# Patient Record
Sex: Female | Born: 1935 | Race: White | Hispanic: No | Marital: Married | Smoking: Never smoker
Health system: Southern US, Community
[De-identification: ages and names within clinical notes are randomized; demographics above are authoritative.]

## PROBLEM LIST (undated history)

## (undated) DIAGNOSIS — M1990 Unspecified osteoarthritis, unspecified site: Secondary | ICD-10-CM

## (undated) DIAGNOSIS — R42 Dizziness and giddiness: Secondary | ICD-10-CM

## (undated) DIAGNOSIS — I10 Essential (primary) hypertension: Secondary | ICD-10-CM

## (undated) HISTORY — PX: OVARIAN CYST SURGERY: SHX726

## (undated) HISTORY — PX: JOINT REPLACEMENT: SHX530

## (undated) HISTORY — PX: TONSILLECTOMY: SUR1361

---

## 2004-06-09 ENCOUNTER — Ambulatory Visit: Payer: Self-pay | Admitting: Internal Medicine

## 2005-06-14 ENCOUNTER — Ambulatory Visit: Payer: Self-pay | Admitting: Internal Medicine

## 2006-06-13 ENCOUNTER — Ambulatory Visit: Payer: Self-pay | Admitting: Internal Medicine

## 2006-08-01 ENCOUNTER — Ambulatory Visit: Payer: Self-pay | Admitting: Internal Medicine

## 2007-08-04 ENCOUNTER — Ambulatory Visit: Payer: Self-pay | Admitting: Internal Medicine

## 2008-08-06 ENCOUNTER — Ambulatory Visit: Payer: Self-pay | Admitting: Internal Medicine

## 2009-08-08 ENCOUNTER — Ambulatory Visit: Payer: Self-pay | Admitting: Internal Medicine

## 2010-08-10 ENCOUNTER — Ambulatory Visit: Payer: Self-pay | Admitting: Internal Medicine

## 2011-08-13 ENCOUNTER — Ambulatory Visit: Payer: Self-pay | Admitting: Internal Medicine

## 2012-08-13 ENCOUNTER — Ambulatory Visit: Payer: Self-pay | Admitting: Internal Medicine

## 2014-03-17 ENCOUNTER — Ambulatory Visit: Payer: Self-pay | Admitting: Internal Medicine

## 2015-07-29 ENCOUNTER — Other Ambulatory Visit: Payer: Self-pay | Admitting: Internal Medicine

## 2015-07-29 DIAGNOSIS — R053 Chronic cough: Secondary | ICD-10-CM

## 2015-07-29 DIAGNOSIS — R05 Cough: Secondary | ICD-10-CM

## 2015-08-22 ENCOUNTER — Ambulatory Visit
Admission: RE | Admit: 2015-08-22 | Discharge: 2015-08-22 | Disposition: A | Discharge status: Home / Self Care | Payer: Medicare Other | Source: Ambulatory Visit | Attending: Internal Medicine | Admitting: Internal Medicine

## 2015-08-22 DIAGNOSIS — J9811 Atelectasis: Secondary | ICD-10-CM | POA: Insufficient documentation

## 2015-08-22 DIAGNOSIS — I2510 Atherosclerotic heart disease of native coronary artery without angina pectoris: Secondary | ICD-10-CM | POA: Insufficient documentation

## 2015-08-22 DIAGNOSIS — R05 Cough: Secondary | ICD-10-CM

## 2015-08-22 DIAGNOSIS — R938 Abnormal findings on diagnostic imaging of other specified body structures: Secondary | ICD-10-CM | POA: Insufficient documentation

## 2015-08-22 DIAGNOSIS — R053 Chronic cough: Secondary | ICD-10-CM

## 2015-10-14 ENCOUNTER — Other Ambulatory Visit: Payer: Self-pay | Admitting: Neurology

## 2015-10-14 DIAGNOSIS — R413 Other amnesia: Secondary | ICD-10-CM

## 2015-10-14 DIAGNOSIS — R270 Ataxia, unspecified: Secondary | ICD-10-CM

## 2015-10-25 ENCOUNTER — Ambulatory Visit
Admission: RE | Admit: 2015-10-25 | Discharge: 2015-10-25 | Disposition: A | Discharge status: Home / Self Care | Payer: Medicare Other | Source: Ambulatory Visit | Attending: Neurology | Admitting: Neurology

## 2015-10-25 DIAGNOSIS — R270 Ataxia, unspecified: Secondary | ICD-10-CM | POA: Diagnosis not present

## 2015-10-25 DIAGNOSIS — R413 Other amnesia: Secondary | ICD-10-CM | POA: Diagnosis present

## 2015-10-25 DIAGNOSIS — G319 Degenerative disease of nervous system, unspecified: Secondary | ICD-10-CM | POA: Insufficient documentation

## 2015-10-25 DIAGNOSIS — R9082 White matter disease, unspecified: Secondary | ICD-10-CM | POA: Insufficient documentation

## 2015-11-09 ENCOUNTER — Other Ambulatory Visit: Payer: Self-pay | Admitting: Internal Medicine

## 2015-11-09 DIAGNOSIS — Z1231 Encounter for screening mammogram for malignant neoplasm of breast: Secondary | ICD-10-CM

## 2015-12-13 ENCOUNTER — Ambulatory Visit
Admission: RE | Admit: 2015-12-13 | Discharge: 2015-12-13 | Disposition: A | Discharge status: Home / Self Care | Payer: Medicare Other | Source: Ambulatory Visit | Attending: Internal Medicine | Admitting: Internal Medicine

## 2015-12-13 DIAGNOSIS — Z1231 Encounter for screening mammogram for malignant neoplasm of breast: Secondary | ICD-10-CM | POA: Insufficient documentation

## 2016-03-02 ENCOUNTER — Emergency Department
Admission: EM | Admit: 2016-03-02 | Discharge: 2016-03-02 | Disposition: A | Discharge status: Home / Self Care | Payer: Medicare Other | Attending: Emergency Medicine | Admitting: Emergency Medicine

## 2016-03-02 DIAGNOSIS — R42 Dizziness and giddiness: Secondary | ICD-10-CM | POA: Insufficient documentation

## 2016-03-02 DIAGNOSIS — R0982 Postnasal drip: Secondary | ICD-10-CM | POA: Diagnosis not present

## 2016-03-02 DIAGNOSIS — I10 Essential (primary) hypertension: Secondary | ICD-10-CM | POA: Diagnosis not present

## 2016-03-02 HISTORY — DX: Essential (primary) hypertension: I10

## 2016-03-02 LAB — GLUCOSE, CAPILLARY: GLUCOSE-CAPILLARY: 94 mg/dL (ref 65–99)

## 2016-03-02 MED ORDER — FLUTICASONE PROPIONATE 50 MCG/ACT NA SUSP: 2.0000 | Freq: Every day | NASAL | 0 refills | Status: AC

## 2016-03-02 MED ORDER — MECLIZINE HCL 25 MG PO TABS: 25.0000 mg | ORAL_TABLET | Freq: Three times a day (TID) | ORAL | 0 refills | Status: AC | PRN

## 2016-03-02 NOTE — ED Triage Notes (Signed)
Pt c/o sinus congestion, right ear pain and dizziness with fast movement. Pt denies dizziness at this time.

## 2016-03-02 NOTE — ED Notes (Signed)
Pt c/o RT ear pain radiating into her throat, started this am. Denies any fevers. Pt states she has an episode of dizziness this am upon waking. Pt A&Ox4.

## 2016-03-02 NOTE — ED Provider Notes (Signed)
[facility] Emergency Department Provider Note  ____________________________________________   First MD Initiated Contact with Patient 03/02/16 1525     (approximate)  I have reviewed the triage vital signs and the nursing notes.   HISTORY  Chief Complaint Otalgia   HPI Sarah Friedman is a 82 y.o. female with a history of hypertension was presenting to the emergency department with right-sided ear pressure as well as vertigo. Said that she wasn't able to walk this morning and it was across the floor because of the feeling of the room spinning. She said that also when she would move her head from side to side especially to the right side that the spinning sensation would worsen. She says that she has had sinus congestion and feels like she has mucus draining to the back of her throat. Denies any weakness or numbness. Says that the symptoms have improved at this time and she is able to ambulate without any difficulty.   Past Medical History:  Diagnosis Date  . Hypertension     There are no active problems to display for this patient.   History reviewed. No pertinent surgical history.  Prior to Admission medications   Not on File    Allergies Aspirin; Penicillins; and Sulfa antibiotics  No family history on file.  Social History Social History  Substance Use Topics  . Smoking status: Never Smoker  . Smokeless tobacco: Not on file  . Alcohol use No    Review of Systems Constitutional: No fever/chills Eyes: No visual changes. ENT: No sore throat. Cardiovascular: Denies chest pain. Respiratory: Denies shortness of breath. Gastrointestinal: No abdominal pain.  No nausea, no vomiting.  No diarrhea.  No constipation. Genitourinary: Negative for dysuria. Musculoskeletal: Negative for back pain. Skin: Negative for rash. Neurological: Negative for headaches, focal weakness or numbness.  10-point ROS otherwise  negative.  ____________________________________________   PHYSICAL EXAM:  VITAL SIGNS: ED Triage Vitals  Enc Vitals Group     BP 03/02/16 1507 (!) 184/84     Pulse Rate 03/02/16 1507 80     Resp 03/02/16 1507 18     Temp 03/02/16 1507 98 F (36.7 C)     Temp Source 03/02/16 1507 Oral     SpO2 03/02/16 1507 100 %     Weight 03/02/16 1507 104 lb (47.2 kg)     Height 03/02/16 1507 5\' 4"  (1.626 m)     Head Circumference --      Peak Flow --      Pain Score 03/02/16 1519 2     Pain Loc --      Pain Edu? --      Excl. in GC? --     Constitutional: Alert and oriented. Well appearing and in no acute distress. Eyes: Conjunctivae are normal. PERRL. EOMI. Head: Atraumatic.TMs normal bilaterally. Nose: No congestion/rhinnorhea. Mouth/Throat: Mucous membranes are moist.  Oropharynx non-erythematous. Neck: No stridor.   Cardiovascular: Normal rate, regular rhythm. Grossly normal heart sounds.   Respiratory: Normal respiratory effort.  No retractions. Lungs CTAB. Gastrointestinal: Soft and nontender. No distention. No abdominal bruits. No CVA tenderness. Musculoskeletal: No lower extremity tenderness nor edema.  No joint effusions. Neurologic:  Normal speech and language. No gross focal neurologic deficits are appreciated. No gait instability.  Walks with a normal gait unassisted. No nystagmus. No ataxia on finger to nose testing. Skin:  Skin is warm, dry and intact. No rash noted. Psychiatric: Mood and affect are normal. Speech and behavior are normal.  ____________________________________________   LABS (all labs ordered are listed, but only abnormal results are displayed)  Labs Reviewed  GLUCOSE, CAPILLARY   ____________________________________________  EKG   ____________________________________________  RADIOLOGY   ____________________________________________   PROCEDURES  Procedure(s) performed:   Procedures  Critical Care performed:    ____________________________________________   INITIAL IMPRESSION / ASSESSMENT AND PLAN / ED COURSE  Pertinent labs & imaging results that were available during my care of the patient were reviewed by me and considered in my medical decision making (see chart for details).   Clinical Course   Patient without any objective findings at this time. Likely eustachian tube dysfunction and viral infection leading to vertigo. We'll prescribe meclizine as well as Flonase. Patient will follow-up with her primary care doctor, Dr. Judithann SheenSparks. She is understanding the plan and willing to comply. No objective findings to indicate concern for stroke. Patient is not taking her blood pressure medication today which likely Spencer hypertension.   ____________________________________________   FINAL CLINICAL IMPRESSION(S) / ED DIAGNOSES  Vertigo. Postnasal drip.    NEW MEDICATIONS STARTED DURING THIS VISIT:  New Prescriptions   No medications on file     Note:  This document was prepared using Dragon voice recognition software and may include unintentional dictation errors.    Myrna Blazeravid Matthew Ovella [place]goats, MD 03/02/16 1537

## 2017-04-15 ENCOUNTER — Encounter: Payer: Self-pay | Admitting: *Deleted

## 2017-04-23 ENCOUNTER — Ambulatory Visit: Payer: Medicare Other | Admitting: Certified Registered Nurse Anesthetist

## 2017-04-23 ENCOUNTER — Ambulatory Visit
Admission: RE | Admit: 2017-04-23 | Discharge: 2017-04-23 | Disposition: A | Discharge status: Home / Self Care | Payer: Medicare Other | Source: Ambulatory Visit | Attending: Ophthalmology | Admitting: Ophthalmology

## 2017-04-23 ENCOUNTER — Encounter
Admission: RE | Disposition: A | Discharge status: Home / Self Care | Payer: Self-pay | Source: Ambulatory Visit | Attending: Ophthalmology

## 2017-04-23 ENCOUNTER — Encounter: Payer: Self-pay | Admitting: Emergency Medicine

## 2017-04-23 DIAGNOSIS — R42 Dizziness and giddiness: Secondary | ICD-10-CM | POA: Diagnosis not present

## 2017-04-23 DIAGNOSIS — Z880 Allergy status to penicillin: Secondary | ICD-10-CM | POA: Insufficient documentation

## 2017-04-23 DIAGNOSIS — I10 Essential (primary) hypertension: Secondary | ICD-10-CM | POA: Insufficient documentation

## 2017-04-23 DIAGNOSIS — Z79899 Other long term (current) drug therapy: Secondary | ICD-10-CM | POA: Insufficient documentation

## 2017-04-23 DIAGNOSIS — H2511 Age-related nuclear cataract, right eye: Secondary | ICD-10-CM | POA: Insufficient documentation

## 2017-04-23 DIAGNOSIS — E785 Hyperlipidemia, unspecified: Secondary | ICD-10-CM | POA: Insufficient documentation

## 2017-04-23 DIAGNOSIS — Z882 Allergy status to sulfonamides status: Secondary | ICD-10-CM | POA: Insufficient documentation

## 2017-04-23 HISTORY — DX: Unspecified osteoarthritis, unspecified site: M19.90

## 2017-04-23 HISTORY — DX: Dizziness and giddiness: R42

## 2017-04-23 HISTORY — PX: CATARACT EXTRACTION W/PHACO: SHX586

## 2017-04-23 SURGERY — PHACOEMULSIFICATION, CATARACT, WITH IOL INSERTION
Anesthesia: Monitor Anesthesia Care | Site: Eye | Laterality: Right | Wound class: Clean

## 2017-04-23 MED ORDER — FENTANYL CITRATE (PF) 100 MCG/2ML IJ SOLN
INTRAMUSCULAR | Status: DC | PRN
  Administered 2017-04-23: 25 ug via INTRAVENOUS

## 2017-04-23 MED ORDER — POVIDONE-IODINE 5 % OP SOLN
OPHTHALMIC | Status: DC | PRN
  Administered 2017-04-23: 1 via OPHTHALMIC

## 2017-04-23 MED ORDER — POVIDONE-IODINE 5 % OP SOLN
OPHTHALMIC | Status: AC
  Filled 2017-04-23: qty 30

## 2017-04-23 MED ORDER — ARMC OPHTHALMIC DILATING DROPS
1.0000 "application " | OPHTHALMIC | Status: AC
  Administered 2017-04-23 (×3): 1 via OPHTHALMIC

## 2017-04-23 MED ORDER — EPINEPHRINE PF 1 MG/ML IJ SOLN
INTRAOCULAR | Status: DC | PRN
  Administered 2017-04-23: 09:00:00 via OPHTHALMIC

## 2017-04-23 MED ORDER — SODIUM CHLORIDE 0.9 % IV SOLN
INTRAVENOUS | Status: DC
  Administered 2017-04-23: 08:00:00 via INTRAVENOUS

## 2017-04-23 MED ORDER — EPINEPHRINE PF 1 MG/ML IJ SOLN
INTRAMUSCULAR | Status: AC
  Filled 2017-04-23: qty 2

## 2017-04-23 MED ORDER — MOXIFLOXACIN HCL 0.5 % OP SOLN
OPHTHALMIC | Status: AC
  Filled 2017-04-23: qty 3

## 2017-04-23 MED ORDER — LIDOCAINE HCL (PF) 4 % IJ SOLN
INTRAOCULAR | Status: DC | PRN
  Administered 2017-04-23: 4 mL via OPHTHALMIC

## 2017-04-23 MED ORDER — NA CHONDROIT SULF-NA HYALURON 40-17 MG/ML IO SOLN
INTRAOCULAR | Status: DC | PRN
  Administered 2017-04-23: 1 mL via INTRAOCULAR

## 2017-04-23 MED ORDER — MOXIFLOXACIN HCL 0.5 % OP SOLN: 1.0000 [drp] | OPHTHALMIC | Status: DC | PRN

## 2017-04-23 MED ORDER — CARBACHOL 0.01 % IO SOLN
INTRAOCULAR | Status: DC | PRN
  Administered 2017-04-23: 0.5 mL via INTRAOCULAR

## 2017-04-23 MED ORDER — FENTANYL CITRATE (PF) 100 MCG/2ML IJ SOLN
INTRAMUSCULAR | Status: AC
  Filled 2017-04-23: qty 2

## 2017-04-23 MED ORDER — ARMC OPHTHALMIC DILATING DROPS
OPHTHALMIC | Status: AC
  Administered 2017-04-23: 1 via OPHTHALMIC
  Filled 2017-04-23: qty 0.4

## 2017-04-23 MED ORDER — NA CHONDROIT SULF-NA HYALURON 40-17 MG/ML IO SOLN
INTRAOCULAR | Status: AC
  Filled 2017-04-23: qty 1

## 2017-04-23 MED ORDER — MOXIFLOXACIN HCL 0.5 % OP SOLN
OPHTHALMIC | Status: DC | PRN
  Administered 2017-04-23: 0.2 mL via OPHTHALMIC

## 2017-04-23 MED ORDER — LIDOCAINE HCL (PF) 4 % IJ SOLN
INTRAMUSCULAR | Status: AC
  Filled 2017-04-23: qty 5

## 2017-04-23 SURGICAL SUPPLY — 18 items
GLOVE BIO SURGEON STRL SZ8 (GLOVE) ×2 IMPLANT
GLOVE BIOGEL M 6.5 STRL (GLOVE) ×2 IMPLANT
GLOVE SURG LX 8.0 MICRO (GLOVE) ×1
GLOVE SURG LX STRL 8.0 MICRO (GLOVE) ×1 IMPLANT
GOWN STRL REUS W/ TWL LRG LVL3 (GOWN DISPOSABLE) ×2 IMPLANT
GOWN STRL REUS W/TWL LRG LVL3 (GOWN DISPOSABLE) ×2
LABEL CATARACT MEDS ST (LABEL) ×2 IMPLANT
LENS IOL ACRSF IQ TRC 5 19.0 ×1 IMPLANT
LENS IOL ACRYSOF IQ TORIC 19.0 ×1 IMPLANT
LENS IOL IQ TORIC 5 19.0 ×1 IMPLANT
PACK CATARACT (MISCELLANEOUS) ×2 IMPLANT
PACK CATARACT BRASINGTON LX (MISCELLANEOUS) ×2 IMPLANT
PACK EYE AFTER SURG (MISCELLANEOUS) ×2 IMPLANT
SOL BSS BAG (MISCELLANEOUS) ×2
SOLUTION BSS BAG (MISCELLANEOUS) ×1 IMPLANT
SYR 5ML LL (SYRINGE) ×2 IMPLANT
WATER STERILE IRR 250ML POUR (IV SOLUTION) ×2 IMPLANT
WIPE NON LINTING 3.25X3.25 (MISCELLANEOUS) ×2 IMPLANT

## 2017-04-23 NOTE — Transfer of Care (Signed)
Immediate Anesthesia Transfer of Care Note  Patient: Sarah Friedman  Procedure(s) Performed: CATARACT EXTRACTION PHACO AND INTRAOCULAR LENS PLACEMENT (IOC) (Right Eye)  Patient Location: PACU  Anesthesia Type:MAC  Level of Consciousness: awake, alert  and oriented  Airway & Oxygen Therapy: Patient Spontanous Breathing  Post-op Assessment: Report given to RN and Post -op Vital signs reviewed and stable  Post vital signs: Reviewed and stable  Last Vitals:  Vitals:   04/23/17 0801  BP: (!) 181/92  Pulse: 69  Resp: 17  Temp: (!) 35.9 C  SpO2: 100%    Last Pain:  Vitals:   04/23/17 0801  TempSrc: Tympanic         Complications: No apparent anesthesia complications

## 2017-04-23 NOTE — Anesthesia Postprocedure Evaluation (Signed)
Anesthesia Post Note  Patient: Sarah Friedman  Procedure(s) Performed: CATARACT EXTRACTION PHACO AND INTRAOCULAR LENS PLACEMENT (IOC) (Right Eye)  Patient location during evaluation: PACU Anesthesia Type: MAC Level of consciousness: awake and alert and oriented Pain management: pain level controlled Vital Signs Assessment: post-procedure vital signs reviewed and stable Respiratory status: spontaneous breathing, nonlabored ventilation and respiratory function stable Cardiovascular status: blood pressure returned to baseline and stable Postop Assessment: no signs of nausea or vomiting Anesthetic complications: no     Last Vitals:  Vitals:   04/23/17 0946 04/23/17 0959  BP: (!) 188/72 (!) 179/86  Pulse: 72 65  Resp: 18 16  Temp: (!) 36.3 C   SpO2: 96% 100%    Last Pain:  Vitals:   04/23/17 0946  TempSrc: Temporal                 Kenniyah Sasaki

## 2017-04-23 NOTE — H&P (Signed)
All labs reviewed. Abnormal studies sent to patients PCP when indicated.  Previous H&P reviewed, patient examined, there are NO CHANGES.  Sarah Dantes Porfilio2/12/20198:48 AM

## 2017-04-23 NOTE — Anesthesia Preprocedure Evaluation (Signed)
Anesthesia Evaluation  Patient identified by MRN, date of birth, ID band Patient awake    Reviewed: Allergy & Precautions, NPO status , Patient's Chart, lab work & pertinent test results  History of Anesthesia Complications Negative for: history of anesthetic complications  Airway Mallampati: II  TM Distance: >3 FB Neck ROM: Full    Dental  (+) Implants   Pulmonary neg pulmonary ROS, neg sleep apnea, neg COPD,    breath sounds clear to auscultation- rhonchi (-) wheezing      Cardiovascular hypertension, Pt. on medications (-) CAD, (-) Past MI, (-) Cardiac Stents and (-) CABG  Rhythm:Regular Rate:Normal - Systolic murmurs and - Diastolic murmurs    Neuro/Psych negative neurological ROS  negative psych ROS   GI/Hepatic negative GI ROS, Neg liver ROS,   Endo/Other  negative endocrine ROSneg diabetes  Renal/GU negative Renal ROS     Musculoskeletal  (+) Arthritis ,   Abdominal (+) - obese,   Peds  Hematology negative hematology ROS (+)   Anesthesia Other Findings Past Medical History: No date: Arthritis No date: Hypertension No date: Vertigo   Reproductive/Obstetrics                             Anesthesia Physical Anesthesia Plan  ASA: II  Anesthesia Plan: MAC   Post-op Pain Management:    Induction: Intravenous  PONV Risk Score and Plan: 2 and Midazolam  Airway Management Planned: Natural Airway  Additional Equipment:   Intra-op Plan:   Post-operative Plan:   Informed Consent: I have reviewed the patients History and Physical, chart, labs and discussed the procedure including the risks, benefits and alternatives for the proposed anesthesia with the patient or authorized representative who has indicated his/her understanding and acceptance.     Plan Discussed with: CRNA and Anesthesiologist  Anesthesia Plan Comments:         Anesthesia Quick Evaluation

## 2017-04-23 NOTE — Op Note (Signed)
PREOPERATIVE DIAGNOSIS:  Nuclear sclerotic cataract of the right eye.   POSTOPERATIVE DIAGNOSIS:  Nuclear sclerotic cataract of the right eye.   OPERATIVE PROCEDURE: Procedure(s): CATARACT EXTRACTION PHACO AND INTRAOCULAR LENS PLACEMENT (IOC)   SURGEON:  Galen ManilaWilliam Neena Beecham, MD.   ANESTHESIA: 1.      Managed anesthesia care. 2.     0.811ml of Shugarcaine was instilled following the paracentesis  Anesthesiologist: Alver FisherPenwarden, Amy, MD CRNA: Malva CoganBeane, Catherine, CRNA  COMPLICATIONS:  None.   TECHNIQUE:   Stop and chop    DESCRIPTION OF PROCEDURE:  The patient was examined and consented in the preoperative holding area where the aforementioned topical anesthesia was applied to the right eye.  The patient was brought back to the Operating Room where he was sat upright on the gurney and given a target to fixate upon while the eye was marked at the 3:00 and 9:00 position.  The patient was then reclined on the operating table.  The eye was prepped and draped in the usual sterile ophthalmic fashion and a lid speculum was placed. A paracentesis was created with the side port blade and the anterior chamber was filled with viscoelastic. A near clear corneal incision was performed with the steel keratome. A continuous curvilinear capsulorrhexis was performed with a cystotome followed by the capsulorrhexis forceps. Hydrodissection and hydrodelineation were carried out with BSS on a blunt cannula. The lens was removed in a stop and chop technique and the remaining cortical material was removed with the irrigation-aspiration handpiece. The eye was inflated with viscoelastic and the ZCT  lens  was placed in the eye and rotated to within a few degrees of the predetermined orientation.  The remaining viscoelastic was removed from the eye.  The Sinskey hook was used to rotate the toric lens into its final resting place at 008 degrees.  0. The eye was inflated to a physiologic pressure and found to be watertight. 0.641ml of  Vigamox was placed in the anterior chamber.  The eye was dressed with Vigamox. The patient was given protective glasses to wear throughout the day and a shield with which to sleep tonight. The patient was also given drops with which to begin a drop regimen today and will follow-up with me in one day. Implant Name Type Inv. Item Serial No. Manufacturer Lot No. LRB No. Used  LENS IOL TORIC 19.0 - [id number]  LENS IOL TORIC 19.0 [id number] ALCON  Right 1   Procedure(s) with comments: CATARACT EXTRACTION PHACO AND INTRAOCULAR LENS PLACEMENT (IOC) (Right) - US 00:43.8 AP% 18.3 CDE 8.05 Fluid Pack lot # [id number]  Electronically signed: Galen ManilaWilliam Etherine Mackowiak 04/23/2017 9:46 AM

## 2017-04-23 NOTE — Anesthesia Post-op Follow-up Note (Signed)
Anesthesia QCDR form completed.        

## 2017-04-23 NOTE — Anesthesia Procedure Notes (Signed)
Procedure Name: MAC Performed by: Demetrius Charity, CRNA Pre-anesthesia Checklist: Patient identified, Emergency Drugs available, Suction available, Patient being monitored and Timeout performed Oxygen Delivery Method: Nasal cannula

## 2017-04-23 NOTE — Discharge Instructions (Signed)
Eye Surgery Discharge Instructions  Expect mild scratchy sensation or mild soreness. DO NOT RUB YOUR EYE!  The day of surgery:  Minimal physical activity, but bed rest is not required  No reading, computer work, or close hand work  No bending, lifting, or straining.  May watch TV  For 24 hours:  No driving, legal decisions, or alcoholic beverages  Safety precautions  Eat anything you prefer: It is better to start with liquids, then soup then solid foods.  _____ Eye patch should be worn until postoperative exam tomorrow.  ____ Solar shield eyeglasses should be worn for comfort in the sunlight/patch while sleeping  Resume all regular medications including aspirin or Coumadin if these were discontinued prior to surgery. You may shower, bathe, shave, or wash your hair. Tylenol may be taken for mild discomfort.  Call your doctor if you experience significant pain, nausea, or vomiting, fever > 101 or other signs of infection. [phone] or [phone] Specific instructions:  Follow-up Information    Galen ManilaPorfilio, William, MD Follow up.   Specialty:  Ophthalmology Why:  February 13 at [facility]9:00am Contact information: [address] [place] [address] [phone]

## 2017-04-24 ENCOUNTER — Encounter: Payer: Self-pay | Admitting: Ophthalmology

## 2017-05-07 ENCOUNTER — Encounter: Payer: Self-pay | Admitting: *Deleted

## 2017-05-14 ENCOUNTER — Ambulatory Visit: Payer: Medicare Other | Admitting: Certified Registered Nurse Anesthetist

## 2017-05-14 ENCOUNTER — Ambulatory Visit
Admission: RE | Admit: 2017-05-14 | Discharge: 2017-05-14 | Disposition: A | Discharge status: Home / Self Care | Payer: Medicare Other | Source: Ambulatory Visit | Attending: Ophthalmology | Admitting: Ophthalmology

## 2017-05-14 ENCOUNTER — Encounter
Admission: RE | Disposition: A | Discharge status: Home / Self Care | Payer: Self-pay | Source: Ambulatory Visit | Attending: Ophthalmology

## 2017-05-14 DIAGNOSIS — Z885 Allergy status to narcotic agent status: Secondary | ICD-10-CM | POA: Insufficient documentation

## 2017-05-14 DIAGNOSIS — I10 Essential (primary) hypertension: Secondary | ICD-10-CM | POA: Insufficient documentation

## 2017-05-14 DIAGNOSIS — H2512 Age-related nuclear cataract, left eye: Secondary | ICD-10-CM | POA: Diagnosis present

## 2017-05-14 DIAGNOSIS — Z888 Allergy status to other drugs, medicaments and biological substances status: Secondary | ICD-10-CM | POA: Insufficient documentation

## 2017-05-14 DIAGNOSIS — Z9841 Cataract extraction status, right eye: Secondary | ICD-10-CM | POA: Diagnosis not present

## 2017-05-14 DIAGNOSIS — Z882 Allergy status to sulfonamides status: Secondary | ICD-10-CM | POA: Diagnosis not present

## 2017-05-14 DIAGNOSIS — Z880 Allergy status to penicillin: Secondary | ICD-10-CM | POA: Diagnosis not present

## 2017-05-14 DIAGNOSIS — Z96652 Presence of left artificial knee joint: Secondary | ICD-10-CM | POA: Diagnosis not present

## 2017-05-14 DIAGNOSIS — Z886 Allergy status to analgesic agent status: Secondary | ICD-10-CM | POA: Insufficient documentation

## 2017-05-14 HISTORY — PX: CATARACT EXTRACTION W/PHACO: SHX586

## 2017-05-14 SURGERY — PHACOEMULSIFICATION, CATARACT, WITH IOL INSERTION
Anesthesia: Monitor Anesthesia Care | Site: Eye | Laterality: Left | Wound class: Clean

## 2017-05-14 MED ORDER — MIDAZOLAM HCL 2 MG/2ML IJ SOLN
INTRAMUSCULAR | Status: AC
  Filled 2017-05-14: qty 2

## 2017-05-14 MED ORDER — MOXIFLOXACIN HCL 0.5 % OP SOLN
OPHTHALMIC | Status: AC
  Filled 2017-05-14: qty 3

## 2017-05-14 MED ORDER — EPINEPHRINE PF 1 MG/ML IJ SOLN
INTRAMUSCULAR | Status: AC
  Filled 2017-05-14: qty 2

## 2017-05-14 MED ORDER — POVIDONE-IODINE 5 % OP SOLN
OPHTHALMIC | Status: AC
  Filled 2017-05-14: qty 30

## 2017-05-14 MED ORDER — SODIUM CHLORIDE 0.9 % IV SOLN
INTRAVENOUS | Status: DC
  Administered 2017-05-14: 10:00:00 via INTRAVENOUS

## 2017-05-14 MED ORDER — ARMC OPHTHALMIC DILATING DROPS
1.0000 "application " | OPHTHALMIC | Status: AC
  Administered 2017-05-14 (×2): 1 via OPHTHALMIC
  Administered 2017-05-14: 10:00:00 via OPHTHALMIC

## 2017-05-14 MED ORDER — LIDOCAINE HCL (PF) 4 % IJ SOLN
INTRAOCULAR | Status: DC | PRN
  Administered 2017-05-14: 4 mL via OPHTHALMIC

## 2017-05-14 MED ORDER — POVIDONE-IODINE 5 % OP SOLN
OPHTHALMIC | Status: DC | PRN
  Administered 2017-05-14: 1 via OPHTHALMIC

## 2017-05-14 MED ORDER — MIDAZOLAM HCL 2 MG/2ML IJ SOLN
INTRAMUSCULAR | Status: DC | PRN
  Administered 2017-05-14: 0.5 mg via INTRAVENOUS
  Administered 2017-05-14: 1 mg via INTRAVENOUS
  Administered 2017-05-14: 0.5 mg via INTRAVENOUS

## 2017-05-14 MED ORDER — MOXIFLOXACIN HCL 0.5 % OP SOLN
OPHTHALMIC | Status: DC | PRN
  Administered 2017-05-14: 0.2 mL via OPHTHALMIC

## 2017-05-14 MED ORDER — NA CHONDROIT SULF-NA HYALURON 40-17 MG/ML IO SOLN
INTRAOCULAR | Status: DC | PRN
  Administered 2017-05-14: 1 mL via INTRAOCULAR

## 2017-05-14 MED ORDER — MOXIFLOXACIN HCL 0.5 % OP SOLN: 1.0000 [drp] | OPHTHALMIC | Status: DC | PRN

## 2017-05-14 MED ORDER — ARMC OPHTHALMIC DILATING DROPS
OPHTHALMIC | Status: AC
  Filled 2017-05-14: qty 0.4

## 2017-05-14 MED ORDER — LIDOCAINE HCL (PF) 4 % IJ SOLN
INTRAMUSCULAR | Status: AC
  Filled 2017-05-14: qty 5

## 2017-05-14 MED ORDER — EPINEPHRINE PF 1 MG/ML IJ SOLN
INTRAOCULAR | Status: DC | PRN
  Administered 2017-05-14: 11:00:00 via OPHTHALMIC

## 2017-05-14 MED ORDER — NA CHONDROIT SULF-NA HYALURON 40-17 MG/ML IO SOLN
INTRAOCULAR | Status: AC
  Filled 2017-05-14: qty 1

## 2017-05-14 MED ORDER — CARBACHOL 0.01 % IO SOLN
INTRAOCULAR | Status: DC | PRN
  Administered 2017-05-14: 0.5 mL via INTRAOCULAR

## 2017-05-14 SURGICAL SUPPLY — 18 items
GLOVE BIO SURGEON STRL SZ8 (GLOVE) ×3 IMPLANT
GLOVE BIOGEL M 6.5 STRL (GLOVE) ×3 IMPLANT
GLOVE SURG LX 8.0 MICRO (GLOVE) ×2
GLOVE SURG LX STRL 8.0 MICRO (GLOVE) ×1 IMPLANT
GOWN STRL REUS W/ TWL LRG LVL3 (GOWN DISPOSABLE) ×2 IMPLANT
GOWN STRL REUS W/TWL LRG LVL3 (GOWN DISPOSABLE) ×4
LABEL CATARACT MEDS ST (LABEL) ×3 IMPLANT
LENS IOL ACRSF IQ TRC 4 18.0 ×1 IMPLANT
LENS IOL ACRYSOF IQ TORIC 18.0 ×2 IMPLANT
LENS IOL IQ TORIC 4 18.0 ×1 IMPLANT
PACK CATARACT (MISCELLANEOUS) ×3 IMPLANT
PACK CATARACT BRASINGTON LX (MISCELLANEOUS) ×3 IMPLANT
PACK EYE AFTER SURG (MISCELLANEOUS) ×3 IMPLANT
SOL BSS BAG (MISCELLANEOUS) ×3
SOLUTION BSS BAG (MISCELLANEOUS) ×1 IMPLANT
SYR 5ML LL (SYRINGE) ×3 IMPLANT
WATER STERILE IRR 250ML POUR (IV SOLUTION) ×3 IMPLANT
WIPE NON LINTING 3.25X3.25 (MISCELLANEOUS) ×3 IMPLANT

## 2017-05-14 NOTE — Anesthesia Postprocedure Evaluation (Signed)
Anesthesia Post Note  Patient: Sarah Friedman  Procedure(s) Performed: CATARACT EXTRACTION PHACO AND INTRAOCULAR LENS PLACEMENT (IOC) (Left Eye)  Patient location during evaluation: PACU Anesthesia Type: MAC Level of consciousness: awake and alert Pain management: pain level controlled Vital Signs Assessment: post-procedure vital signs reviewed and stable Respiratory status: spontaneous breathing, nonlabored ventilation, respiratory function stable and patient connected to nasal cannula oxygen Cardiovascular status: stable and blood pressure returned to baseline Postop Assessment: no apparent nausea or vomiting Anesthetic complications: no     Last Vitals:  Vitals:   05/14/17 0925 05/14/17 1057  BP: (!) 190/62 (!) 154/70  Pulse: 60 (!) 56  Resp: 18 16  Temp: (!) 36.4 C 36.7 C  SpO2: 98% 100%    Last Pain:  Vitals:   05/14/17 1057  TempSrc: Temporal                 Martha Clan

## 2017-05-14 NOTE — Transfer of Care (Signed)
Immediate Anesthesia Transfer of Care Note  Patient: Sarah Friedman  Procedure(s) Performed: CATARACT EXTRACTION PHACO AND INTRAOCULAR LENS PLACEMENT (IOC) (Left Eye)  Patient Location: Short Stay  Anesthesia Type:MAC  Level of Consciousness: awake, alert , oriented and patient cooperative  Airway & Oxygen Therapy: Patient Spontanous Breathing  Post-op Assessment: Report given to RN and Post -op Vital signs reviewed and stable  Post vital signs: Reviewed and stable  Last Vitals:  Vitals:   05/14/17 0925  BP: (!) 190/62  Pulse: 60  Resp: 18  Temp: (!) 36.4 C  SpO2: 98%    Last Pain:  Vitals:   05/14/17 0925  TempSrc: Tympanic         Complications: No apparent anesthesia complications

## 2017-05-14 NOTE — Anesthesia Post-op Follow-up Note (Signed)
Anesthesia QCDR form completed.        

## 2017-05-14 NOTE — Anesthesia Preprocedure Evaluation (Signed)
Anesthesia Evaluation  Patient identified by MRN, date of birth, ID band Patient awake    Reviewed: Allergy & Precautions, NPO status , Patient's Chart, lab work & pertinent test results  History of Anesthesia Complications Negative for: history of anesthetic complications  Airway Mallampati: II  TM Distance: >3 FB Neck ROM: Full    Dental  (+) Implants   Pulmonary neg pulmonary ROS, neg sleep apnea, neg COPD,    breath sounds clear to auscultation- rhonchi (-) wheezing      Cardiovascular hypertension, Pt. on medications (-) CAD, (-) Past MI, (-) Cardiac Stents and (-) CABG  Rhythm:Regular Rate:Normal - Systolic murmurs and - Diastolic murmurs    Neuro/Psych negative neurological ROS  negative psych ROS   GI/Hepatic negative GI ROS, Neg liver ROS,   Endo/Other  negative endocrine ROSneg diabetes  Renal/GU negative Renal ROS     Musculoskeletal  (+) Arthritis ,   Abdominal (+) - obese,   Peds  Hematology negative hematology ROS (+)   Anesthesia Other Findings Past Medical History: No date: Arthritis No date: Hypertension No date: Vertigo   Reproductive/Obstetrics                             Anesthesia Physical Anesthesia Plan  ASA: II  Anesthesia Plan: MAC   Post-op Pain Management:    Induction: Intravenous  PONV Risk Score and Plan: 2 and Midazolam  Airway Management Planned: Natural Airway  Additional Equipment:   Intra-op Plan:   Post-operative Plan:   Informed Consent: I have reviewed the patients History and Physical, chart, labs and discussed the procedure including the risks, benefits and alternatives for the proposed anesthesia with the patient or authorized representative who has indicated his/her understanding and acceptance.     Plan Discussed with: CRNA and Anesthesiologist  Anesthesia Plan Comments:         Anesthesia Quick Evaluation  

## 2017-05-14 NOTE — Op Note (Signed)
PREOPERATIVE DIAGNOSIS:  Nuclear sclerotic cataract of the left eye.   POSTOPERATIVE DIAGNOSIS:  Nuclear sclerotic cataract of the left eye.   OPERATIVE PROCEDURE: Procedure(s): CATARACT EXTRACTION PHACO AND INTRAOCULAR LENS PLACEMENT (IOC)   SURGEON:  Galen ManilaWilliam Sivan Quast, MD.   ANESTHESIA: 1.      Managed anesthesia care. 2.     0.661ml os Shugarcaine was instilled following the paracentesis 2oranesstaff@   COMPLICATIONS:  None.   TECHNIQUE:   Stop and chop    DESCRIPTION OF PROCEDURE:  The patient was examined and consented in the preoperative holding area where the aforementioned topical anesthesia was applied to the left eye.  The patient was brought back to the Operating Room where he was sat upright on the gurney and given a target to fixate upon while the eye was marked at the 3:00 and 9:00 position.  The patient was then reclined on the operating table.  The eye was prepped and draped in the usual sterile ophthalmic fashion and a lid speculum was placed. A paracentesis was created with the side port blade and the anterior chamber was filled with viscoelastic. A near clear corneal incision was performed with the steel keratome. A continuous curvilinear capsulorrhexis was performed with a cystotome followed by the capsulorrhexis forceps. Hydrodissection and hydrodelineation were carried out with BSS on a blunt cannula. The lens was removed in a stop and chop technique and the remaining cortical material was removed with the irrigation-aspiration handpiece. The eye was inflated with viscoelastic and the ZCT lens was placed in the eye and rotated to within a few degrees of the predetermined orientation.  The remaining viscoelastic was removed from the eye.  The Sinskey hook was used to rotate the toric lens into its final resting place at 165 degrees.  0.1 ml of Vigamox was placed in the anterior chamber. The eye was inflated to a physiologic pressure and found to be watertight.  The eye was dressed  with Vigamox. The patient was given protective glasses to wear throughout the day and a shield with which to sleep tonight. The patient was also given drops with which to begin a drop regimen today and will follow-up with me in one day. Implant Name Type Inv. Item Serial No. Manufacturer Lot No. LRB No. Used  LENS IOL TORIC 18.0 - [id number]  LENS IOL TORIC 18.0 [id number] ALCON  Left 1   Procedure(s) with comments: CATARACT EXTRACTION PHACO AND INTRAOCULAR LENS PLACEMENT (IOC) (Left) - US 00:29.5 AP% 17.2 CDE 5.07 Fluid Pack Lot # [id number] H  Electronically signed: Galen ManilaWilliam Shantee Hayne 3/5/201910:56 AM

## 2017-05-14 NOTE — H&P (Signed)
All labs reviewed. Abnormal studies sent to patients PCP when indicated.  Previous H&P reviewed, patient examined, there are NO CHANGES.  Elina Streng Porfilio3/5/201910:24 AM

## 2017-05-14 NOTE — OR Nursing (Signed)
IV not documented preop - #22G RAC d/c'd on arrival to postop

## 2017-05-14 NOTE — Discharge Instructions (Signed)
FOLLOW DR. PORFILIO'S POSTOP EYE DROP INSTRUCTION SHEET AS REVIEWED. Eye Surgery Discharge Instructions  Expect mild scratchy sensation or mild soreness. DO NOT RUB YOUR EYE!  The day of surgery:  Minimal physical activity, but bed rest is not required  No reading, computer work, or close hand work  No bending, lifting, or straining.  May watch TV  For 24 hours:  No driving, legal decisions, or alcoholic beverages  Safety precautions  Eat anything you prefer: It is better to start with liquids, then soup then solid foods.  _____ Eye patch should be worn until postoperative exam tomorrow.  ____ Solar shield eyeglasses should be worn for comfort in the sunlight/patch while sleeping  Resume all regular medications including aspirin or Coumadin if these were discontinued prior to surgery. You may shower, bathe, shave, or wash your hair. Tylenol may be taken for mild discomfort.  Call your doctor if you experience significant pain, nausea, or vomiting, fever > 101 or other signs of infection. [phone] or [phone] Specific instructions:  Follow-up Information    Galen ManilaPorfilio, William, MD Follow up.   Specialty:  Ophthalmology Why:  05/15/17 @ 1:40 pm Contact information: [address] [place] [address] [phone]

## 2017-07-08 IMAGING — CT CT CHEST W/O CM
2 of 3 series · 16 of 46 positions shown, 18 images · non-contrast
Comparison: Chest radiographs report dated 06/10/2015.

CLINICAL DATA: Cough since pneumonia diagnosed in [REDACTED].

EXAM:
CT CHEST WITHOUT CONTRAST
TECHNIQUE: Multidetector CT imaging of the chest was performed following the
standard protocol without IV contrast.

[Series 2: routine chest wo · axial · 0.50mm/px · z∈[-190,+105]mm · 13 of 69 slices shown, 15 images]
[im 5/69  soft-tissue]
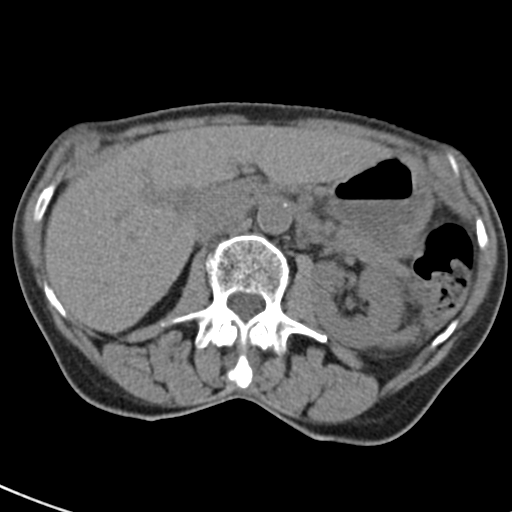
[im 5/69  bone]
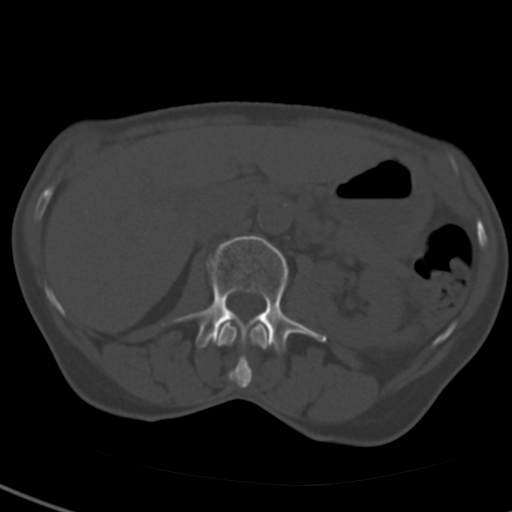
[im 9/69  soft-tissue]
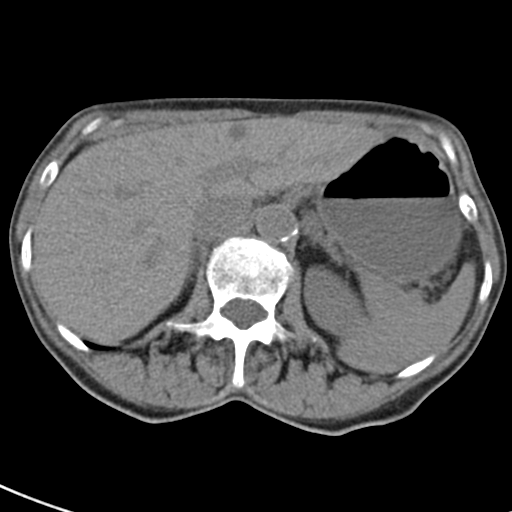
[im 14/69  soft-tissue]
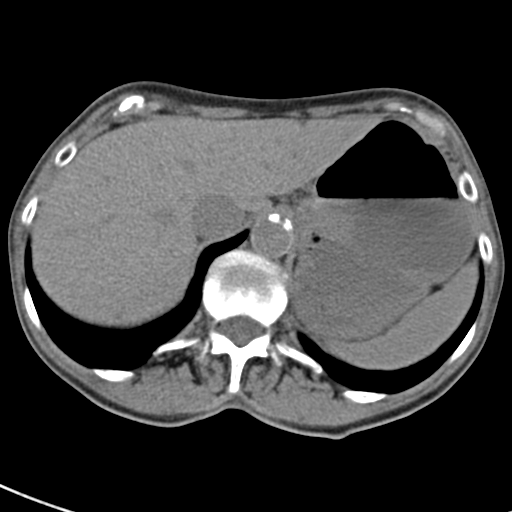
[im 20/69  soft-tissue]
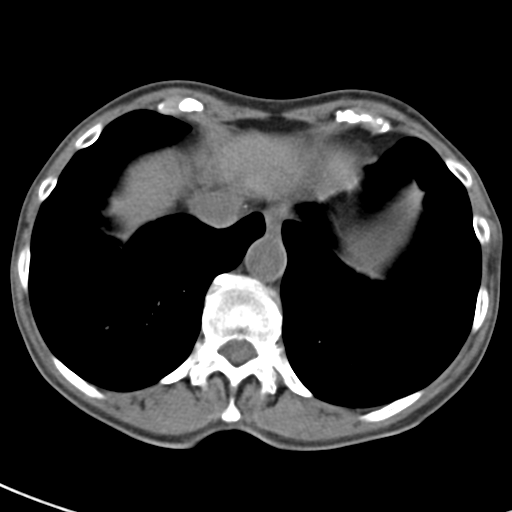
[im 25/69  soft-tissue]
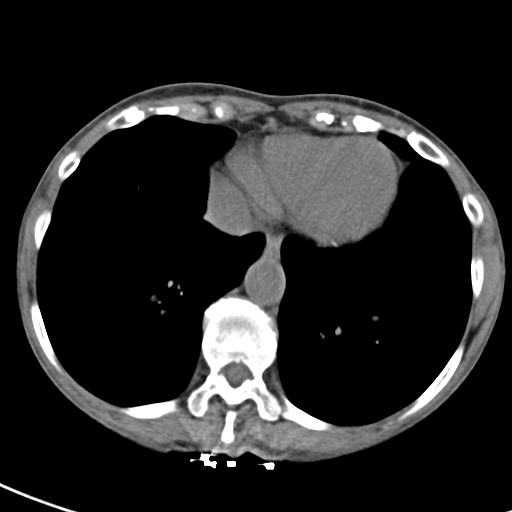
[im 29/69  soft-tissue]
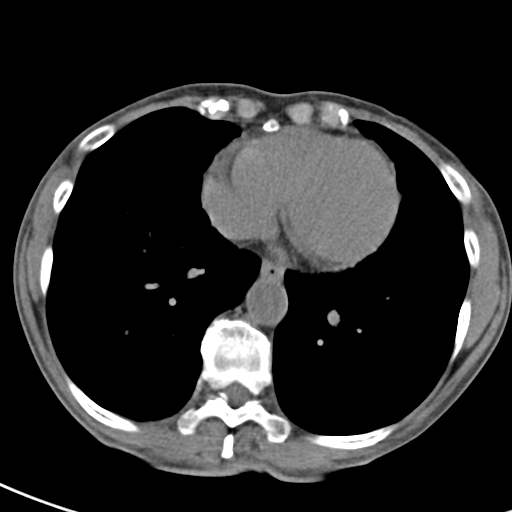
[im 36/69  soft-tissue]
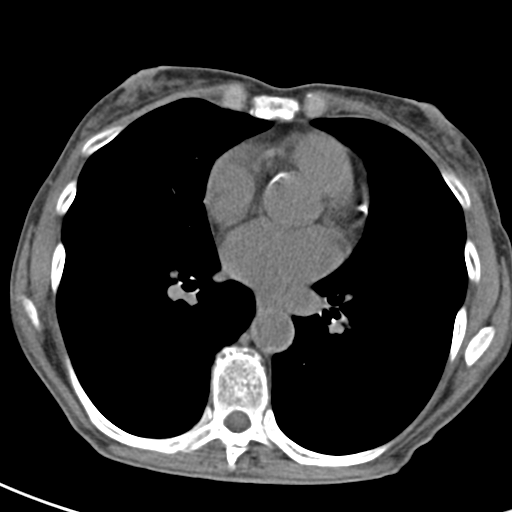
[im 40/69  soft-tissue]
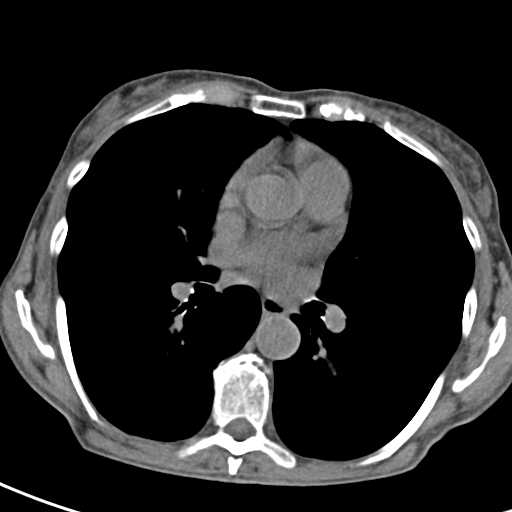
[im 44/69  soft-tissue]
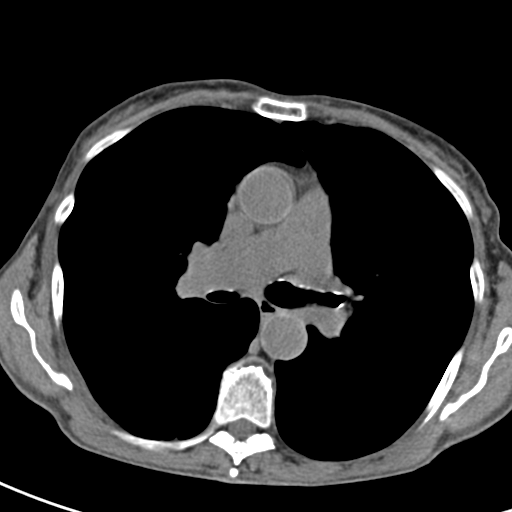
[im 44/69  bone]
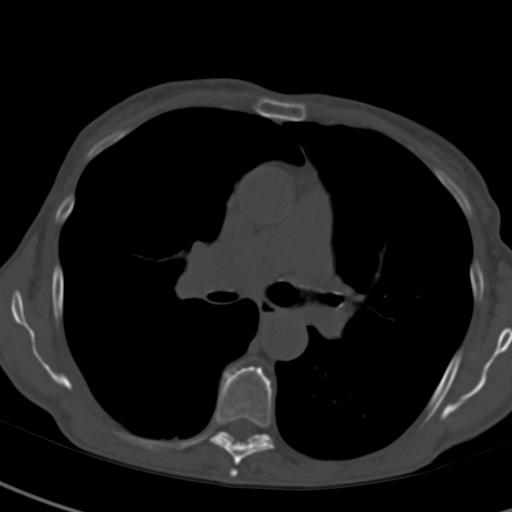
[im 49/69  soft-tissue]
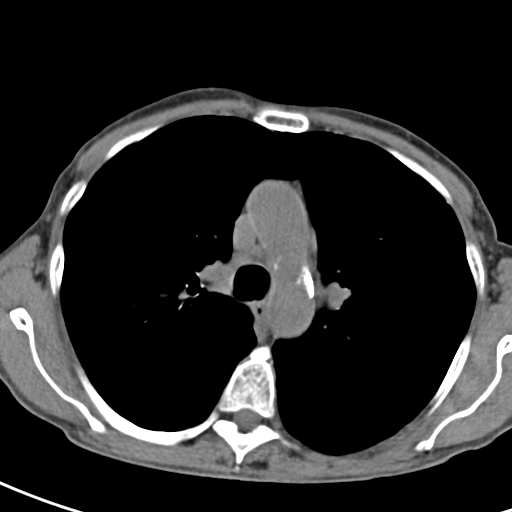
[im 55/69  soft-tissue]
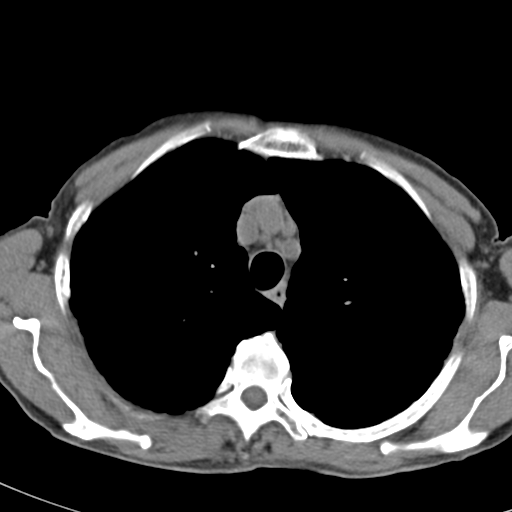
[im 60/69  soft-tissue]
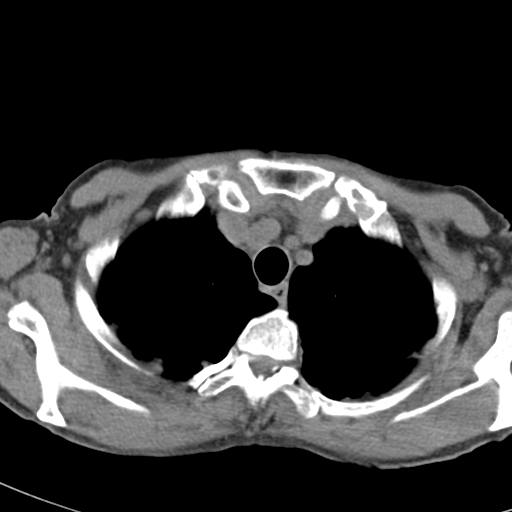
[im 64/69  soft-tissue]
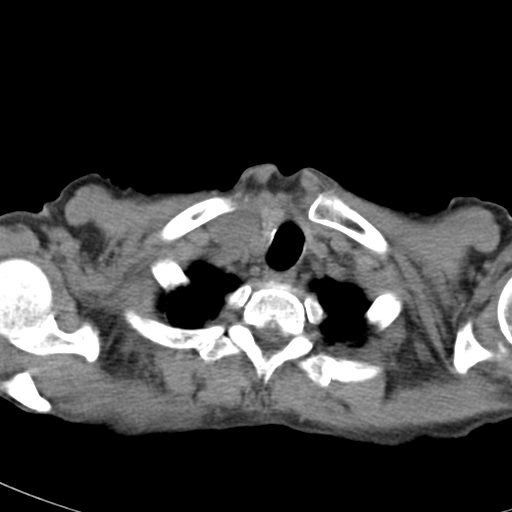

[Series 5: routine chest wo cor · coronal · 0.50mm/px · 3 of 106 slices shown]
[im 36/106  soft-tissue]
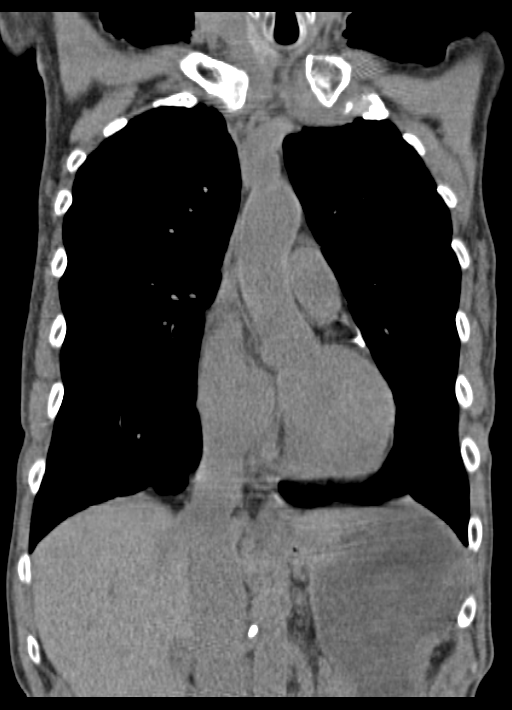
[im 47/106  soft-tissue]
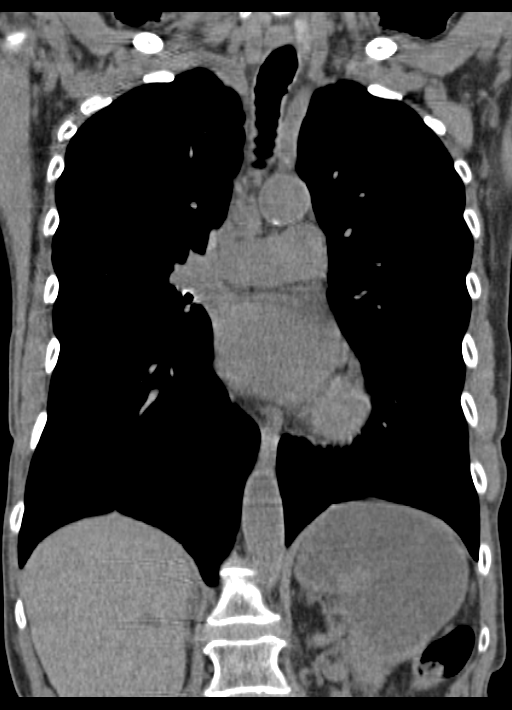
[im 59/106  soft-tissue]
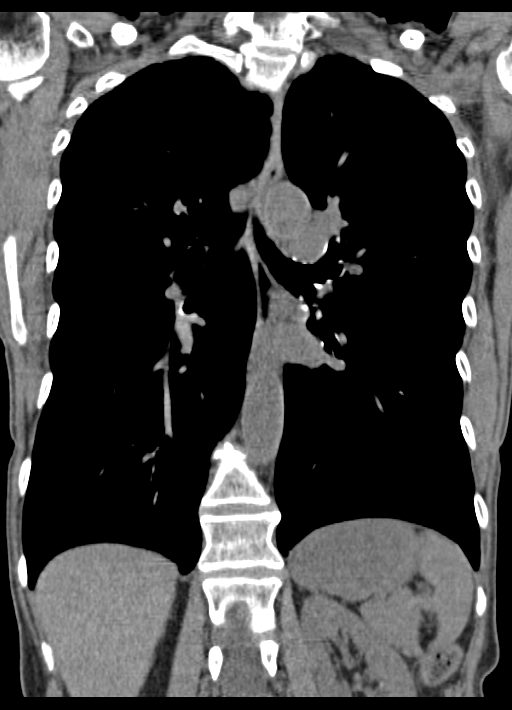

[16 of 46 positions shown; findings below may reference images not displayed]

FINDINGS: Mediastinum/Lymph Nodes: Diffuse low density of the blood relative
to the arterial walls. Atheromatous arterial calcifications,
including coronary artery calcification. No enlarged lymph nodes.

Lungs/Pleura: Mild diffuse peribronchial thickening. Small amount of
confluent and patchy density in the lingula. Biapical pleural and
parenchymal scarring. No lung nodules. No pleural fluid.

Upper abdomen: No acute findings.

Musculoskeletal: Thoracic spine degenerative changes.
IMPRESSION: 1. Small amount of atelectasis or scarring in the lingula with a
small amount of possible pneumonia or inflammatory changes in that
region.
2. Mild bronchitic changes.
3. Atheromatous coronary artery calcifications.
4. Diffuse low density of the blood relative to the arterial walls,
compatible with anemia.

## 2018-01-23 ENCOUNTER — Ambulatory Visit: Payer: Medicare Other

## 2018-01-23 ENCOUNTER — Ambulatory Visit: Payer: Medicare Other | Attending: Infectious Diseases

## 2018-01-23 ENCOUNTER — Other Ambulatory Visit: Payer: Self-pay

## 2018-01-23 ENCOUNTER — Other Ambulatory Visit
Admission: RE | Admit: 2018-01-23 | Discharge: 2018-01-23 | Disposition: A | Discharge status: Home / Self Care | Payer: Medicare Other | Source: Ambulatory Visit | Attending: Orthopaedic Surgery | Admitting: Orthopaedic Surgery

## 2018-01-23 DIAGNOSIS — Z5181 Encounter for therapeutic drug level monitoring: Secondary | ICD-10-CM | POA: Insufficient documentation

## 2018-01-23 DIAGNOSIS — R2689 Other abnormalities of gait and mobility: Secondary | ICD-10-CM

## 2018-01-23 DIAGNOSIS — Z7901 Long term (current) use of anticoagulants: Secondary | ICD-10-CM | POA: Insufficient documentation

## 2018-01-23 DIAGNOSIS — M25561 Pain in right knee: Secondary | ICD-10-CM | POA: Diagnosis present

## 2018-01-23 DIAGNOSIS — M25661 Stiffness of right knee, not elsewhere classified: Secondary | ICD-10-CM | POA: Diagnosis not present

## 2018-01-23 DIAGNOSIS — M6281 Muscle weakness (generalized): Secondary | ICD-10-CM

## 2018-01-23 LAB — PROTIME-INR
INR: 1.43
PROTHROMBIN TIME: 17.3 s — AB (ref 11.4–15.2)

## 2018-01-23 NOTE — Therapy (Signed)
[facility] MAIN [facility] SERVICES [address] [place], [place], [address] Phone: [phone]   Fax:  [phone]  Physical Therapy Evaluation  Patient Details  Name: Sarah Friedman MRN: [id number] Date of Birth: 01/09/1934 Referring Provider (PT): Candiss Norse , [place]    Encounter Date: 01/23/2018  PT End of Session - 01/23/18 1517    Visit Number  1    Number of Visits  8    Date for PT Re-Evaluation  02/20/18    Authorization Type  1/10 PN start 11/ 14    PT Start Time  1252    PT Stop Time  1345    PT Time Calculation (min)  53 min    Equipment Utilized During Treatment  Gait belt    Activity Tolerance  Patient tolerated treatment well    Behavior During Therapy  WFL for tasks assessed/performed       Past Medical History:  Diagnosis Date  . Arthritis   . Hypertension   . Vertigo     Past Surgical History:  Procedure Laterality Date  . CATARACT EXTRACTION W/PHACO Right 04/23/2017   Procedure: CATARACT EXTRACTION PHACO AND INTRAOCULAR LENS PLACEMENT (IOC);  Surgeon: Galen Manila, MD;  Location: [facility] ORS;  Service: Ophthalmology;  Laterality: Right;  [place] 00:43.8 AP% 18.3 CDE 8.05 Fluid Pack lot # [id number]  . CATARACT EXTRACTION W/PHACO Left 05/14/2017   Procedure: CATARACT EXTRACTION PHACO AND INTRAOCULAR LENS PLACEMENT (IOC);  Surgeon: Galen Manila, MD;  Location: [facility] ORS;  Service: Ophthalmology;  Laterality: Left;  [place] 00:29.5 AP% 17.2 CDE 5.07 Fluid Pack Lot # [id number]  . JOINT REPLACEMENT Left    TKR  . OVARIAN CYST SURGERY    . TONSILLECTOMY      There were no vitals filed for this visit.    Subjective Assessment - 01/23/18 1305    Subjective  Patient is a pleasant 84 year old female who recieved a R TKA on 01/09/18.     Pertinent History  Patient is a pleasant 84 year old female who received a R TKA on 01/09/18. PMH includes HTN, HLD, anemia, osteopenia, right ventricular end diastolic pressure elevation.  Patient presents with son, using cane for the first time. Was not walking with an AD prior to surgery. Has a history of L TKA. No pain medication being taken, just Tylenol. Had two week of home health and her son helped her with her home health HEP. Wants to return to playing with grandchildren.     Limitations  Lifting;Standing;Walking;House hold activities;Other (comment)    How long can you sit comfortably?  n/a    How long can you stand comfortably?  hasn't been standing much    How long can you walk comfortably?  only been able to walk 4 rounds of house    Diagnostic tests  sx    Patient Stated Goals  play with grandchildren, return to normal routine     Currently in Pain?  No/denies      PAIN: Current Pain: 0/10 took Tylenol prior to therapy Worst pain: 7/10 ;    POSTURE: Seated:  Weight shift to L side, pelvic tilt for offloading R hip/knee. R knee slightly extended and in front of L.  Standing:  R knee lightly bent to offload limb. Cane in L hand.   PROM/AROM: R knee: supine:    flexion     PROM 103     AROM 95  Extension   PROM  -8  AROM -10   R hamstring length limited at this time.   STRENGTH:  Graded on a 0-5 scale Muscle Group Left Right  Hip Flex 5/5 3+/5  Hip Abd 5/5 3/5  Hip Add 4+/5 3/5   Hip Ext 4/5 3/5  Hip IR/ER    Knee Flex 4+/5 2+/5  Knee Ext 4+/5 2+/5  Ankle DF 4+/5 3+/5  Ankle PF 4+/5 3+/5   SENSATION/ COORDINATION WFL Heel shin test: dysmetria due to R knee pain/limited motion.   SPECIAL TESTS: Cane adjusted to proper height for patient.   FUNCTIONAL MOBILITY: Independent bed mobility with extra time for moving R leg.  Sit to stand: RLE slightly in front of L, bilateral UE support, pelvic slide to L.   BALANCE: Requires use of a cane for dynamic activities such as ambulation.    GAIT: Decreased weight acceptance upon RLE with limited knee extension and flexion. Ambulate with use of a cane in L hand.   OUTCOME MEASURES: TEST  Outcome Interpretation  5 times sit<>stand 22 sec with UE support  5>60 yo, >15 sec indicates increased risk for falls  10 meter walk test     .71            M/s with cane <1.0 m/s indicates increased risk for falls; limited community ambulator  LEFS 40/80                   Treat: Quad set education  ROM AROM Gait mechanics Posture education    [facility] - 01/23/18 0001      Assessment   Medical Diagnosis   R TKA     Referring Provider (PT)  Candiss NorseJon Hendershot , [place]     Onset Date/Surgical Date  01/09/18    Hand Dominance  Right    Next MD Visit  12/30/17    Prior Therapy  yes      Precautions   Precautions  Knee   out of precautions now   Required Braces or Orthoses  --   none     Restrictions   Weight Bearing Restrictions  No      Balance Screen   Has the patient fallen in the past 6 months  No    Has the patient had a decrease in activity level because of a fear of falling?   Yes    Is the patient reluctant to leave their home because of a fear of falling?   No      Home Public house managernvironment   Living Environment  Private residence    Living Arrangements  Spouse/significant other    Available Help at Discharge  Family    Type of Home  House    Home Access  Stairs to enter    Entrance Stairs-Number of Steps  5    Entrance Stairs-Rails  None   per patient report   Home Layout  Two level   bedroom on first floor, bathrooms on second floor   Alternate Level Stairs-Number of Steps  15    Home Equipment  Walker - standard;Cane - single point;Hand held shower head;Bedside commode      Prior Function   Level of Independence  Independent    Vocation  Retired    Leisure  watch grandchildren after school      Cognition   Overall Cognitive Status  Within Functional Limits for tasks assessed      Observation/Other Assessments   Observations  bandaging still on R knee; knee  heated to touch but not abnormally so. no tenderness to calf.       Sensation   Light Touch   Appears Intact      Coordination   Gross Motor Movements are Fluid and Coordinated  No                Objective measurements completed on examination: See above findings.              PT Education - 01/23/18 1503    Education Details  ROM, posture, goals, POC     Person(s) Educated  Patient;Child(ren)    Methods  Explanation;Demonstration;Tactile cues;Verbal cues    Comprehension  Verbalized understanding       PT Short Term Goals - 01/23/18 1522      PT SHORT TERM GOAL #1   Title  Patient will be independent in home exercise program to improve strength/mobility for better functional independence with ADLs    Baseline  compliance with HH HEP     Time  2    Period  Weeks    Status  New    Target Date  02/06/18      PT SHORT TERM GOAL #2   Title  Patient will perform sit to stand without UE suport to demonstrate improved LE strength and stability     Baseline  11/14: require BUE support     Time  2    Period  Weeks    Status  New    Target Date  02/06/18      PT SHORT TERM GOAL #3   Title  Patient will increase R knee AROM to 105 degrees flexion and -5 degrees extension for improved mobility and gait mechanics    Baseline  11/14: AROM: 95 flexion extension -10.     Time  2    Period  Weeks    Status  New    Target Date  02/06/18      PT SHORT TERM GOAL #4   Title  Patient will increase R knee strength to 3/5 as to improve functional strength for independent gait, increased standing tolerance and increased ADL ability.    Baseline  11/14: 2+/5     Time  2    Period  Weeks    Status  New    Target Date  02/06/18        PT Long Term Goals - 01/23/18 1525      PT LONG TERM GOAL #1   Title  Patient will increase R LE strength to 4+/5 as to improve functional strength for independent gait, increased standing tolerance and increased ADL ability.    Baseline  11/14: R knee 2+/5    Time  4    Period  Weeks    Status  New    Target Date  02/20/18       PT LONG TERM GOAL #2   Title  Patient will report a worst pain of 3/10 on VAS in  R knee  to improve tolerance with ADLs and reduced symptoms with activities.     Baseline  11/14: 7/10 worst pain     Time  4    Period  Weeks    Status  New    Target Date  02/20/18      PT LONG TERM GOAL #3   Title  Patient will increase R knee AROM to 120 degrees flexion and neutral extension for return to PLOF and natural ambulatory mechanics.  Baseline  11/14: R knee 95 flexion -10 extension     Time  4    Period  Weeks    Status  New    Target Date  02/20/18      PT LONG TERM GOAL #4   Title  Patient (> 38 years old) will complete five times sit to stand test in < 15 seconds indicating an increased LE strength and improved balance.    Baseline  11/14: 22 seconds with excessive UE support and R knee forward     Time  4    Period  Weeks    Status  New    Target Date  02/20/18      PT LONG TERM GOAL #5   Title  Patient will increase lower extremity functional scale to >60/80 to demonstrate improved functional mobility and increased tolerance with ADLs.     Baseline  11/14: 40/80     Time  4    Period  Weeks    Status  New    Target Date  02/20/18      Additional Long Term Goals   Additional Long Term Goals  Yes      PT LONG TERM GOAL #6   Title  Patient will increase 10 meter walk test to >1.73m/s with least restrictive device to improve gait speed for better community ambulation and to reduce fall risk.    Baseline  11/14: .71 m/s with cane    Time  4    Period  Weeks    Status  New    Target Date  02/20/18             Plan - 01/23/18 1518    Clinical Impression Statement  Patient is a pleasant 84 year old female who received a R TKA on 01/09/18.  She presents utilizing a single point cane with today being her first attempt at using it. Patient Active ROM: 95 degrees with Passive ROM being 103, extension is lacking by -8 degrees at this time. 5x STS performed in 22 seconds  with excessive UE support and with R foot in front of L, 10 MWT performed in .71 m/s with cane, and LEFS scored to 40/80. Patient does not take pain medication besides Tylenol and was awake for her surgery per patient and son's report. Patient is very active and is motivated to return to prior level of function. Patient will benefit from skilled physical therapy to reduce pain, improve ROM and strength, and increase mobility for return to PLOF.      History and Personal Factors relevant to plan of care:  This patient presents with  1- 2, personal factors/ comorbidities, and 1-2  body elements including body structures and functions, activity limitations and or participation restrictions. Patient's condition is stable.    Clinical Presentation  Stable    Clinical Presentation due to:  pain levels being tolerated with only tylenol     Clinical Decision Making  Low    Rehab Potential  Good    Clinical Impairments Affecting Rehab Potential  (+) family support, prior level of function, high pain tolerance (-) age    PT Frequency  2x / week    PT Duration  4 weeks    PT Treatment/Interventions  ADLs/Self Care Home Management;Aquatic Therapy;Cryotherapy;Electrical Stimulation;Iontophoresis 4mg /ml Dexamethasone;Moist Heat;Traction;Ultrasound;DME Instruction;Balance training;Therapeutic exercise;Therapeutic activities;Functional mobility training;Stair training;Gait training;Neuromuscular re-education;Patient/family education;Scar mobilization;Compression bandaging;Manual lymph drainage;Manual techniques;Passive range of motion;Dry needling;Energy conservation;Splinting;Taping    PT Next Visit Plan  ROM,  strength     PT Home Exercise Plan  continue HEP     Consulted and Agree with Plan of Care  Patient;Family member/caregiver    Family Member Consulted  son       Patient will benefit from skilled therapeutic intervention in order to improve the following deficits and impairments:  Abnormal gait, Decreased  activity tolerance, Decreased balance, Decreased endurance, Decreased coordination, Decreased mobility, Decreased range of motion, Decreased scar mobility, Decreased strength, Difficulty walking, Increased edema, Hypomobility, Impaired perceived functional ability, Impaired flexibility, Improper body mechanics, Postural dysfunction, Pain  Visit Diagnosis: Stiffness of right knee, not elsewhere classified  Acute pain of right knee  Other abnormalities of gait and mobility  Muscle weakness (generalized)     Problem List There are no active problems to display for this patient.  Precious Bard, PT, DPT   01/23/2018, 3:31 PM  [facility] MAIN [facility] SERVICES [address] [place], [place], [address] Phone: [phone]   Fax:  [phone]  Name: BULA CAVALIERI MRN: [id number] Date of Birth: January 11, 1934

## 2018-01-23 NOTE — Patient Instructions (Signed)
Continued Home Health HEP"  Quad set,   SAQ  Heel slide

## 2018-01-27 ENCOUNTER — Other Ambulatory Visit
Admission: RE | Admit: 2018-01-27 | Discharge: 2018-01-27 | Disposition: A | Discharge status: Home / Self Care | Payer: Medicare Other | Source: Ambulatory Visit | Attending: Orthopaedic Surgery | Admitting: Orthopaedic Surgery

## 2018-01-27 ENCOUNTER — Ambulatory Visit: Payer: Medicare Other

## 2018-01-27 DIAGNOSIS — Z01818 Encounter for other preprocedural examination: Secondary | ICD-10-CM | POA: Insufficient documentation

## 2018-01-27 DIAGNOSIS — Z7901 Long term (current) use of anticoagulants: Secondary | ICD-10-CM | POA: Diagnosis not present

## 2018-01-27 LAB — PROTIME-INR
INR: 1.8
Prothrombin Time: 20.7 s — ABNORMAL HIGH (ref 11.4–15.2)

## 2018-01-29 ENCOUNTER — Ambulatory Visit: Payer: Medicare Other

## 2018-01-29 DIAGNOSIS — M25661 Stiffness of right knee, not elsewhere classified: Secondary | ICD-10-CM | POA: Diagnosis not present

## 2018-01-29 DIAGNOSIS — M6281 Muscle weakness (generalized): Secondary | ICD-10-CM

## 2018-01-29 DIAGNOSIS — M25561 Pain in right knee: Secondary | ICD-10-CM

## 2018-01-29 DIAGNOSIS — R2689 Other abnormalities of gait and mobility: Secondary | ICD-10-CM

## 2018-01-29 NOTE — Therapy (Signed)
[place] [facility] MAIN [facility] SERVICES [address] [place], [place], [address] Phone: [phone]   Fax:  [phone]  Physical Therapy Treatment  Patient Details  Name: Sarah Friedman MRN: [id number] Date of Birth: 03-Nov-1933 Referring Provider (PT): Candiss Norse , [place]    Encounter Date: 01/29/2018  PT End of Session - 01/29/18 1110    Visit Number  2    Number of Visits  8    Date for PT Re-Evaluation  02/20/18    Authorization Type  2/10 PN start 11/ 14    PT Start Time  1116    PT Stop Time  1200    PT Time Calculation (min)  44 min    Equipment Utilized During Treatment  Gait belt    Activity Tolerance  Patient tolerated treatment well    Behavior During Therapy  [facility] for tasks assessed/performed       Past Medical History:  Diagnosis Date  . Arthritis   . Hypertension   . Vertigo     Past Surgical History:  Procedure Laterality Date  . CATARACT EXTRACTION W/PHACO Right 04/23/2017   Procedure: CATARACT EXTRACTION PHACO AND INTRAOCULAR LENS PLACEMENT (IOC);  Surgeon: Galen Manila, MD;  Location: [facility] ORS;  Service: Ophthalmology;  Laterality: Right;  [place] 00:43.8 AP% 18.3 CDE 8.05 Fluid Pack lot # [id number] H  . CATARACT EXTRACTION W/PHACO Left 05/14/2017   Procedure: CATARACT EXTRACTION PHACO AND INTRAOCULAR LENS PLACEMENT (IOC);  Surgeon: Galen Manila, MD;  Location: [facility] ORS;  Service: Ophthalmology;  Laterality: Left;  [place] 00:29.5 AP% 17.2 CDE 5.07 Fluid Pack Lot # [id number]  . JOINT REPLACEMENT Left    TKR  . OVARIAN CYST SURGERY    . TONSILLECTOMY      There were no vitals filed for this visit.  Subjective Assessment - 01/29/18 1118    Subjective  Patient was ill but is feeling better now, had diahhrea and  was so ill she was not able to leave bed. Pain in medial aspect of R knee from not moving it. Patient arrived using walker but left using cane.      Pertinent History  Patient is a pleasant 84 year old female who  received a R TKA on 01/09/18. PMH includes HTN, HLD, anemia, osteopenia, right ventricular end diastolic pressure elevation. Patient presents with son, using cane for the first time. Was not walking with an AD prior to surgery. Has a history of L TKA. No pain medication being taken, just Tylenol. Had two week of home health and her son helped her with her home health HEP. Wants to return to playing with grandchildren.     Limitations  Lifting;Standing;Walking;House hold activities;Other (comment)    How long can you sit comfortably?  n/a    How long can you stand comfortably?  hasn't been standing much    How long can you walk comfortably?  only been able to walk 4 rounds of house    Diagnostic tests  sx    Patient Stated Goals  play with grandchildren, return to normal routine     Currently in Pain?  Yes    Pain Score  7     Pain Location  Knee    Pain Orientation  Right    Pain Descriptors / Indicators  Aching    Pain Type  Acute pain    Pain Onset  More than a month ago       Manual:  Contract relax pushing against PT  shoulder 5 seconds then knee flexion passively. 15x Patellofemoral mobilizations: inferior, superior, medial, lateral. 30 seconds each direction 4x   Contract relax agonist: 10x 10-20 second holds in seated position.   STM adductors and medial quadriceps  3 minutes to reduce pain   AP mobilization of R knee 10x 5 second mobilizations   Hamstring stretch 2x60 seconds RLE  PROM flexion and extension 10x each direction, hold 10-20 seconds  TherEx  Seated LAQ 10x ; cues for keeping leg on table Sit to stand from raised plinth table; green mat under LLE for R weight shift   Flexion supine: PROM: 113 Extension supine:PROM -5                           PT Education - 01/29/18 1109    Education Details  exercise technique, manual    Person(s) Educated  Patient    Methods  Explanation;Demonstration;Verbal cues    Comprehension  Verbalized  understanding;Returned demonstration       PT Short Term Goals - 01/23/18 1522      PT SHORT TERM GOAL #1   Title  Patient will be independent in home exercise program to improve strength/mobility for better functional independence with ADLs    Baseline  compliance with HH HEP     Time  2    Period  Weeks    Status  New    Target Date  02/06/18      PT SHORT TERM GOAL #2   Title  Patient will perform sit to stand without UE suport to demonstrate improved LE strength and stability     Baseline  11/14: require BUE support     Time  2    Period  Weeks    Status  New    Target Date  02/06/18      PT SHORT TERM GOAL #3   Title  Patient will increase R knee AROM to 105 degrees flexion and -5 degrees extension for improved mobility and gait mechanics    Baseline  11/14: AROM: 95 flexion extension -10.     Time  2    Period  Weeks    Status  New    Target Date  02/06/18      PT SHORT TERM GOAL #4   Title  Patient will increase R knee strength to 3/5 as to improve functional strength for independent gait, increased standing tolerance and increased ADL ability.    Baseline  11/14: 2+/5     Time  2    Period  Weeks    Status  New    Target Date  02/06/18        PT Long Term Goals - 01/23/18 1525      PT LONG TERM GOAL #1   Title  Patient will increase R LE strength to 4+/5 as to improve functional strength for independent gait, increased standing tolerance and increased ADL ability.    Baseline  11/14: R knee 2+/5    Time  4    Period  Weeks    Status  New    Target Date  02/20/18      PT LONG TERM GOAL #2   Title  Patient will report a worst pain of 3/10 on VAS in  R knee  to improve tolerance with ADLs and reduced symptoms with activities.     Baseline  11/14: 7/10 worst pain     Time  4  Period  Weeks    Status  New    Target Date  02/20/18      PT LONG TERM GOAL #3   Title  Patient will increase R knee AROM to 120 degrees flexion and neutral extension for return  to PLOF and natural ambulatory mechanics.     Baseline  11/14: R knee 95 flexion -10 extension     Time  4    Period  Weeks    Status  New    Target Date  02/20/18      PT LONG TERM GOAL #4   Title  Patient (> 103 years old) will complete five times sit to stand test in < 15 seconds indicating an increased LE strength and improved balance.    Baseline  11/14: 22 seconds with excessive UE support and R knee forward     Time  4    Period  Weeks    Status  New    Target Date  02/20/18      PT LONG TERM GOAL #5   Title  Patient will increase lower extremity functional scale to >60/80 to demonstrate improved functional mobility and increased tolerance with ADLs.     Baseline  11/14: 40/80     Time  4    Period  Weeks    Status  New    Target Date  02/20/18      Additional Long Term Goals   Additional Long Term Goals  Yes      PT LONG TERM GOAL #6   Title  Patient will increase 10 meter walk test to >1.26m/s with least restrictive device to improve gait speed for better community ambulation and to reduce fall risk.    Baseline  11/14: .71 m/s with cane    Time  4    Period  Weeks    Status  New    Target Date  02/20/18            Plan - 01/29/18 1304    Clinical Impression Statement  Patient returning to therapy after illness. During illness patient was not able to be compliant with HEP resulting in increased stiffness and pain in post surgical  R knee. Patient ROM performed passively with PROM flexion 113 and extension -5. Patient has noted weight shift deviation from RLE requiring visual and tactile cueing. Pain reduced to 0/10 by end of session. Patient will benefit from skilled physical therapy to reduce pain, improve ROM and strength, and increase mobility for return to PLOF.     Rehab Potential  Good    Clinical Impairments Affecting Rehab Potential  (+) family support, prior level of function, high pain tolerance (-) age    PT Frequency  2x / week    PT Duration  4 weeks     PT Treatment/Interventions  ADLs/Self Care Home Management;Aquatic Therapy;Cryotherapy;Electrical Stimulation;Iontophoresis 4mg /ml Dexamethasone;Moist Heat;Traction;Ultrasound;DME Instruction;Balance training;Therapeutic exercise;Therapeutic activities;Functional mobility training;Stair training;Gait training;Neuromuscular re-education;Patient/family education;Scar mobilization;Compression bandaging;Manual lymph drainage;Manual techniques;Passive range of motion;Dry needling;Energy conservation;Splinting;Taping    PT Next Visit Plan  ROM, strength     PT Home Exercise Plan  continue HEP     Consulted and Agree with Plan of Care  Patient;Family member/caregiver    Family Member Consulted  son       Patient will benefit from skilled therapeutic intervention in order to improve the following deficits and impairments:  Abnormal gait, Decreased activity tolerance, Decreased balance, Decreased endurance, Decreased coordination, Decreased mobility, Decreased range of motion,  Decreased scar mobility, Decreased strength, Difficulty walking, Increased edema, Hypomobility, Impaired perceived functional ability, Impaired flexibility, Improper body mechanics, Postural dysfunction, Pain  Visit Diagnosis: Stiffness of right knee, not elsewhere classified  Acute pain of right knee  Other abnormalities of gait and mobility  Muscle weakness (generalized)     Problem List There are no active problems to display for this patient.  Precious BardMarina Haliegh Khurana, PT, DPT   01/29/2018, 1:06 PM  [place] [facility] [address] [place], [place], [address] Phone: [phone]   Fax:  [phone]  Name: Lou Calatricia L Jim MRN: [id number] Date of Birth: 07/22/1933

## 2018-02-03 ENCOUNTER — Other Ambulatory Visit
Admission: RE | Admit: 2018-02-03 | Discharge: 2018-02-03 | Disposition: A | Discharge status: Home / Self Care | Payer: Medicare Other | Source: Ambulatory Visit | Attending: Orthopaedic Surgery | Admitting: Orthopaedic Surgery

## 2018-02-03 ENCOUNTER — Ambulatory Visit: Payer: Medicare Other

## 2018-02-03 DIAGNOSIS — M25661 Stiffness of right knee, not elsewhere classified: Secondary | ICD-10-CM

## 2018-02-03 DIAGNOSIS — M6281 Muscle weakness (generalized): Secondary | ICD-10-CM

## 2018-02-03 DIAGNOSIS — R2689 Other abnormalities of gait and mobility: Secondary | ICD-10-CM

## 2018-02-03 DIAGNOSIS — M25561 Pain in right knee: Secondary | ICD-10-CM

## 2018-02-03 DIAGNOSIS — Z7901 Long term (current) use of anticoagulants: Secondary | ICD-10-CM | POA: Diagnosis present

## 2018-02-03 LAB — PROTIME-INR
INR: 1.36
Prothrombin Time: 16.6 seconds — ABNORMAL HIGH (ref 11.4–15.2)

## 2018-02-03 NOTE — Therapy (Signed)
[facility] MAIN [facility] SERVICES [address] [place], [place], [address] Phone: [phone]   Fax:  [phone]  Physical Therapy Treatment  Patient Details  Name: Sarah Friedman MRN: [id number] Date of Birth: [birth year] Referring Provider (PT): Candiss Norse , [place]    Encounter Date: 02/03/2018  PT End of Session - 02/03/18 1424    Visit Number  3    Number of Visits  8    Date for PT Re-Evaluation  02/20/18    Authorization Type  3/10 PN start 11/ 14    PT Start Time  1351    PT Stop Time  1430    PT Time Calculation (min)  39 min    Equipment Utilized During Treatment  Gait belt    Activity Tolerance  Patient tolerated treatment well    Behavior During Therapy  WFL for tasks assessed/performed       Past Medical History:  Diagnosis Date  . Arthritis   . Hypertension   . Vertigo     Past Surgical History:  Procedure Laterality Date  . CATARACT EXTRACTION W/PHACO Right 04/23/2017   Procedure: CATARACT EXTRACTION PHACO AND INTRAOCULAR LENS PLACEMENT (IOC);  Surgeon: Galen Manila, MD;  Location: [facility] ORS;  Service: Ophthalmology;  Laterality: Right;  [place] 00:43.8 AP% 18.3 CDE 8.05 Fluid Pack lot # [id number]  . CATARACT EXTRACTION W/PHACO Left 05/14/2017   Procedure: CATARACT EXTRACTION PHACO AND INTRAOCULAR LENS PLACEMENT (IOC);  Surgeon: Galen Manila, MD;  Location: [facility] ORS;  Service: Ophthalmology;  Laterality: Left;  [place] 00:29.5 AP% 17.2 CDE 5.07 Fluid Pack Lot # [id number]  . JOINT REPLACEMENT Left    TKR  . OVARIAN CYST SURGERY    . TONSILLECTOMY      There were no vitals filed for this visit.  Subjective Assessment - 02/03/18 1422    Subjective  Patient reports she is feeling very stiff today. Went to [place] to get her bandage off Thursday and reports doctor was happy with her knee.  Has been having a hard time sleeping lately despite going to bed early.     Pertinent History  Patient is a pleasant 84 year old  female who received a R TKA on 01/09/18. PMH includes HTN, HLD, anemia, osteopenia, right ventricular end diastolic pressure elevation. Patient presents with son, using cane for the first time. Was not walking with an AD prior to surgery. Has a history of L TKA. No pain medication being taken, just Tylenol. Had two week of home health and her son helped her with her home health HEP. Wants to return to playing with grandchildren.     Limitations  Lifting;Standing;Walking;House hold activities;Other (comment)    How long can you sit comfortably?  n/a    How long can you stand comfortably?  hasn't been standing much    How long can you walk comfortably?  only been able to walk 4 rounds of house    Diagnostic tests  sx    Patient Stated Goals  play with grandchildren, return to normal routine     Currently in Pain?  Yes    Pain Score  6     Pain Location  Knee    Pain Orientation  Right    Pain Descriptors / Indicators  Aching    Pain Type  Acute pain    Pain Onset  More than a month ago    Pain Frequency  Intermittent     Nustep Lvl 1 seat position  8 5 minutes   Manual:  Contract relax pushing against PT shoulder 5 seconds then knee flexion passively. 15x Patellofemoral mobilizations: inferior, superior, medial, lateral. 30 seconds each direction 4x   Tibiofemoral mobilizations AP  Contract relax agonist: 10x 10-20 second holds in seated position.              STM adductors and medial quadriceps  3 minutes to reduce pain              AP mobilization of R knee 10x 5 second mobilizations              Hamstring stretch 2x60 seconds RLE             PROM flexion and extension 10x each direction, hold 10-20 seconds   TherEx  Supine: SLR 10x forward, 10x ER, 10x IR   Ambulate with cane: SPC with cues for changing hand placement 2x 46 ft.    Flexion supine: PROM: 117 Extension supine:PROM -3                         PT Education - 02/03/18 1423    Education Details   exercise technique, manual, new step    Person(s) Educated  Patient    Methods  Explanation;Demonstration;Verbal cues;Tactile cues    Comprehension  Verbalized understanding;Returned demonstration;Verbal cues required;Tactile cues required;Need further instruction       PT Short Term Goals - 01/23/18 1522      PT SHORT TERM GOAL #1   Title  Patient will be independent in home exercise program to improve strength/mobility for better functional independence with ADLs    Baseline  compliance with HH HEP     Time  2    Period  Weeks    Status  New    Target Date  02/06/18      PT SHORT TERM GOAL #2   Title  Patient will perform sit to stand without UE suport to demonstrate improved LE strength and stability     Baseline  11/14: require BUE support     Time  2    Period  Weeks    Status  New    Target Date  02/06/18      PT SHORT TERM GOAL #3   Title  Patient will increase R knee AROM to 105 degrees flexion and -5 degrees extension for improved mobility and gait mechanics    Baseline  11/14: AROM: 95 flexion extension -10.     Time  2    Period  Weeks    Status  New    Target Date  02/06/18      PT SHORT TERM GOAL #4   Title  Patient will increase R knee strength to 3/5 as to improve functional strength for independent gait, increased standing tolerance and increased ADL ability.    Baseline  11/14: 2+/5     Time  2    Period  Weeks    Status  New    Target Date  02/06/18        PT Long Term Goals - 01/23/18 1525      PT LONG TERM GOAL #1   Title  Patient will increase R LE strength to 4+/5 as to improve functional strength for independent gait, increased standing tolerance and increased ADL ability.    Baseline  11/14: R knee 2+/5    Time  4    Period  Weeks  Status  New    Target Date  02/20/18      PT LONG TERM GOAL #2   Title  Patient will report a worst pain of 3/10 on VAS in  R knee  to improve tolerance with ADLs and reduced symptoms with activities.      Baseline  11/14: 7/10 worst pain     Time  4    Period  Weeks    Status  New    Target Date  02/20/18      PT LONG TERM GOAL #3   Title  Patient will increase R knee AROM to 120 degrees flexion and neutral extension for return to PLOF and natural ambulatory mechanics.     Baseline  11/14: R knee 95 flexion -10 extension     Time  4    Period  Weeks    Status  New    Target Date  02/20/18      PT LONG TERM GOAL #4   Title  Patient (> 61 years old) will complete five times sit to stand test in < 15 seconds indicating an increased LE strength and improved balance.    Baseline  11/14: 22 seconds with excessive UE support and R knee forward     Time  4    Period  Weeks    Status  New    Target Date  02/20/18      PT LONG TERM GOAL #5   Title  Patient will increase lower extremity functional scale to >60/80 to demonstrate improved functional mobility and increased tolerance with ADLs.     Baseline  11/14: 40/80     Time  4    Period  Weeks    Status  New    Target Date  02/20/18      Additional Long Term Goals   Additional Long Term Goals  Yes      PT LONG TERM GOAL #6   Title  Patient will increase 10 meter walk test to >1.74m/s with least restrictive device to improve gait speed for better community ambulation and to reduce fall risk.    Baseline  11/14: .71 m/s with cane    Time  4    Period  Weeks    Status  New    Target Date  02/20/18            Plan - 02/03/18 1427    Clinical Impression Statement  Patient presents with increased ROM with flexion performed in supine to 117 degrees. Patient continues to have pain in posterior aspect of knee, relieved with movement indicating potential musculature component. Patient is very motivated and not fearful of pain to reach her goals. Patient will benefit from skilled physical therapy to reduce pain, improve ROM and strength, and increase mobility for return to PLOF.    Rehab Potential  Good    Clinical Impairments Affecting  Rehab Potential  (+) family support, prior level of function, high pain tolerance (-) age    PT Frequency  2x / week    PT Duration  4 weeks    PT Treatment/Interventions  ADLs/Self Care Home Management;Aquatic Therapy;Cryotherapy;Electrical Stimulation;Iontophoresis 4mg /ml Dexamethasone;Moist Heat;Traction;Ultrasound;DME Instruction;Balance training;Therapeutic exercise;Therapeutic activities;Functional mobility training;Stair training;Gait training;Neuromuscular re-education;Patient/family education;Scar mobilization;Compression bandaging;Manual lymph drainage;Manual techniques;Passive range of motion;Dry needling;Energy conservation;Splinting;Taping    PT Next Visit Plan  ROM, strength     PT Home Exercise Plan  continue HEP     Consulted and Agree with Plan of Care  Patient;Family member/caregiver    Family Member Consulted  son       Patient will benefit from skilled therapeutic intervention in order to improve the following deficits and impairments:  Abnormal gait, Decreased activity tolerance, Decreased balance, Decreased endurance, Decreased coordination, Decreased mobility, Decreased range of motion, Decreased scar mobility, Decreased strength, Difficulty walking, Increased edema, Hypomobility, Impaired perceived functional ability, Impaired flexibility, Improper body mechanics, Postural dysfunction, Pain  Visit Diagnosis: Stiffness of right knee, not elsewhere classified  Acute pain of right knee  Other abnormalities of gait and mobility  Muscle weakness (generalized)     Problem List There are no active problems to display for this patient.  Precious Bard, PT, DPT   02/03/2018, 2:34 PM  [facility] MAIN [facility] SERVICES [address] [place], [place], [address] Phone: [phone]   Fax:  [phone]  Name: Sarah Friedman MRN: [id number] Date of Birth: 15-May-1933

## 2018-02-05 ENCOUNTER — Ambulatory Visit: Payer: Medicare Other

## 2018-02-10 ENCOUNTER — Ambulatory Visit: Payer: Medicare Other | Attending: Infectious Diseases

## 2018-02-10 DIAGNOSIS — M25661 Stiffness of right knee, not elsewhere classified: Secondary | ICD-10-CM | POA: Insufficient documentation

## 2018-02-10 DIAGNOSIS — M25561 Pain in right knee: Secondary | ICD-10-CM | POA: Diagnosis present

## 2018-02-10 DIAGNOSIS — R2689 Other abnormalities of gait and mobility: Secondary | ICD-10-CM | POA: Insufficient documentation

## 2018-02-10 DIAGNOSIS — M6281 Muscle weakness (generalized): Secondary | ICD-10-CM | POA: Diagnosis present

## 2018-02-10 NOTE — Therapy (Signed)
[facility] MAIN [facility] SERVICES [address] [place], [place], [address] Phone: [phone]   Fax:  [phone]  Physical Therapy Treatment  Patient Details  Name: Sarah Friedman MRN: [id number] Date of Birth: [birth year] Referring Provider (PT): Candiss Norse , [place]    Encounter Date: 02/10/2018  PT End of Session - 02/10/18 1352    Visit Number  4    Number of Visits  8    Date for PT Re-Evaluation  02/20/18    Authorization Type  4/10 PN start 11/ 14    PT Start Time  1345    PT Stop Time  1430    PT Time Calculation (min)  45 min    Equipment Utilized During Treatment  Gait belt    Activity Tolerance  Patient tolerated treatment well    Behavior During Therapy  [facility] for tasks assessed/performed       Past Medical History:  Diagnosis Date  . Arthritis   . Hypertension   . Vertigo     Past Surgical History:  Procedure Laterality Date  . CATARACT EXTRACTION W/PHACO Right 04/23/2017   Procedure: CATARACT EXTRACTION PHACO AND INTRAOCULAR LENS PLACEMENT (IOC);  Surgeon: Galen Manila, MD;  Location: [facility] ORS;  Service: Ophthalmology;  Laterality: Right;  [place] 00:43.8 AP% 18.3 CDE 8.05 Fluid Pack lot # [id number] H  . CATARACT EXTRACTION W/PHACO Left 05/14/2017   Procedure: CATARACT EXTRACTION PHACO AND INTRAOCULAR LENS PLACEMENT (IOC);  Surgeon: Galen Manila, MD;  Location: [facility] ORS;  Service: Ophthalmology;  Laterality: Left;  [place] 00:29.5 AP% 17.2 CDE 5.07 Fluid Pack Lot # [id number]  . JOINT REPLACEMENT Left    TKR  . OVARIAN CYST SURGERY    . TONSILLECTOMY      There were no vitals filed for this visit.  Subjective Assessment - 02/10/18 1349    Subjective  Patient missed last weeks session due to being sick. Has only eaten peanut butter sandwiches and toast due to her stomach hurting. Has been taken off [place] a few days ago.  Patient reports today she has had the worst pain today in her knee.     Pertinent History  Patient  is a pleasant 84 year old female who received a R TKA on 01/09/18. PMH includes HTN, HLD, anemia, osteopenia, right ventricular end diastolic pressure elevation. Patient presents with son, using cane for the first time. Was not walking with an AD prior to surgery. Has a history of L TKA. No pain medication being taken, just Tylenol. Had two week of home health and her son helped her with her home health HEP. Wants to return to playing with grandchildren.     Limitations  Lifting;Standing;Walking;House hold activities;Other (comment)    How long can you sit comfortably?  n/a    How long can you stand comfortably?  hasn't been standing much    How long can you walk comfortably?  only been able to walk 4 rounds of house    Diagnostic tests  sx    Patient Stated Goals  play with grandchildren, return to normal routine     Currently in Pain?  Yes    Pain Score  6     Pain Location  Knee    Pain Orientation  Right    Pain Descriptors / Indicators  Aching    Pain Type  Acute pain    Pain Onset  More than a month ago    Pain Frequency  Intermittent  Nustep Lvl 1 seat position 8 5 minutes   Manual:  Contract relax pushing against PT shoulder 5 seconds then knee flexion passively. 15x Patellofemoral mobilizations: inferior, superior, medial, lateral. 30 seconds each direction 4x   Tibiofemoral mobilizations AP              STM adductors and medial quadriceps  3 minutes to reduce pain              AP mobilization of R knee 10x 5 second mobilizations              Hamstring stretch 2x60 seconds RLE             PROM flexion and extension 10x each direction, hold 10-20 seconds   Scar tissue massage 2 minutes: education on how to perform at home  Ice cup massage 3 minutes  TherEx Stair stretch: flexion 2x 30 seconds, extension 2x 30 seconds    Ambulate with cane: SPC with cues for changing hand placement 2x 46 ft.    Stair negotiation with SPC CGA: up with good, down with bad, patient  demonstrating understanding.   Flexion supine: PROM: 120 Extension supine:PROM -3                          PT Education - 02/10/18 1351    Education Details  exercise technique, manual, scar tissue massage     Person(s) Educated  Patient    Methods  Explanation;Demonstration;Verbal cues    Comprehension  Verbalized understanding;Returned demonstration       PT Short Term Goals - 01/23/18 1522      PT SHORT TERM GOAL #1   Title  Patient will be independent in home exercise program to improve strength/mobility for better functional independence with ADLs    Baseline  compliance with HH HEP     Time  2    Period  Weeks    Status  New    Target Date  02/06/18      PT SHORT TERM GOAL #2   Title  Patient will perform sit to stand without UE suport to demonstrate improved LE strength and stability     Baseline  11/14: require BUE support     Time  2    Period  Weeks    Status  New    Target Date  02/06/18      PT SHORT TERM GOAL #3   Title  Patient will increase R knee AROM to 105 degrees flexion and -5 degrees extension for improved mobility and gait mechanics    Baseline  11/14: AROM: 95 flexion extension -10.     Time  2    Period  Weeks    Status  New    Target Date  02/06/18      PT SHORT TERM GOAL #4   Title  Patient will increase R knee strength to 3/5 as to improve functional strength for independent gait, increased standing tolerance and increased ADL ability.    Baseline  11/14: 2+/5     Time  2    Period  Weeks    Status  New    Target Date  02/06/18        PT Long Term Goals - 01/23/18 1525      PT LONG TERM GOAL #1   Title  Patient will increase R LE strength to 4+/5 as to improve functional strength for independent gait, increased standing tolerance  and increased ADL ability.    Baseline  11/14: R knee 2+/5    Time  4    Period  Weeks    Status  New    Target Date  02/20/18      PT LONG TERM GOAL #2   Title  Patient will  report a worst pain of 3/10 on VAS in  R knee  to improve tolerance with ADLs and reduced symptoms with activities.     Baseline  11/14: 7/10 worst pain     Time  4    Period  Weeks    Status  New    Target Date  02/20/18      PT LONG TERM GOAL #3   Title  Patient will increase R knee AROM to 120 degrees flexion and neutral extension for return to PLOF and natural ambulatory mechanics.     Baseline  11/14: R knee 95 flexion -10 extension     Time  4    Period  Weeks    Status  New    Target Date  02/20/18      PT LONG TERM GOAL #4   Title  Patient (> 63 years old) will complete five times sit to stand test in < 15 seconds indicating an increased LE strength and improved balance.    Baseline  11/14: 22 seconds with excessive UE support and R knee forward     Time  4    Period  Weeks    Status  New    Target Date  02/20/18      PT LONG TERM GOAL #5   Title  Patient will increase lower extremity functional scale to >60/80 to demonstrate improved functional mobility and increased tolerance with ADLs.     Baseline  11/14: 40/80     Time  4    Period  Weeks    Status  New    Target Date  02/20/18      Additional Long Term Goals   Additional Long Term Goals  Yes      PT LONG TERM GOAL #6   Title  Patient will increase 10 meter walk test to >1.50m/s with least restrictive device to improve gait speed for better community ambulation and to reduce fall risk.    Baseline  11/14: .71 m/s with cane    Time  4    Period  Weeks    Status  New    Target Date  02/20/18            Plan - 02/10/18 1432    Clinical Impression Statement  Patient demonstrates improved passive ROM with flexion reaching 120 degrees. Patient continues to present with antalgic gait and having high pain due to recent illness and poor mobility while ill. Patient educated on stair negotiation for safe mobility in home. Patient HEP had scar tissue massage and ice cup massage added to reduce swelling. Patient will  benefit from skilled physical therapy to reduce pain, improve ROM and strength, and increase mobility for return to PLOF.    Rehab Potential  Good    Clinical Impairments Affecting Rehab Potential  (+) family support, prior level of function, high pain tolerance (-) age    PT Frequency  2x / week    PT Duration  4 weeks    PT Treatment/Interventions  ADLs/Self Care Home Management;Aquatic Therapy;Cryotherapy;Electrical Stimulation;Iontophoresis 4mg /ml Dexamethasone;Moist Heat;Traction;Ultrasound;DME Instruction;Balance training;Therapeutic exercise;Therapeutic activities;Functional mobility training;Stair training;Gait training;Neuromuscular re-education;Patient/family education;Scar mobilization;Compression bandaging;Manual lymph drainage;Manual  techniques;Passive range of motion;Dry needling;Energy conservation;Splinting;Taping    PT Next Visit Plan  ROM, strength     PT Home Exercise Plan  continue HEP     Consulted and Agree with Plan of Care  Patient;Family member/caregiver    Family Member Consulted  son       Patient will benefit from skilled therapeutic intervention in order to improve the following deficits and impairments:  Abnormal gait, Decreased activity tolerance, Decreased balance, Decreased endurance, Decreased coordination, Decreased mobility, Decreased range of motion, Decreased scar mobility, Decreased strength, Difficulty walking, Increased edema, Hypomobility, Impaired perceived functional ability, Impaired flexibility, Improper body mechanics, Postural dysfunction, Pain  Visit Diagnosis: Stiffness of right knee, not elsewhere classified  Acute pain of right knee  Other abnormalities of gait and mobility  Muscle weakness (generalized)     Problem List There are no active problems to display for this patient.  Precious BardMarina Margues Filippini, PT, DPT   02/10/2018, 2:33 PM  [place] [facility] [address] [place], [place],  [id number] Phone: [phone]   Fax:  [phone]  Name: Lou Calatricia L Raigoza MRN: [id number] Date of Birth: 12/21/1933

## 2018-02-12 ENCOUNTER — Ambulatory Visit: Payer: Medicare Other

## 2018-02-12 DIAGNOSIS — M25661 Stiffness of right knee, not elsewhere classified: Secondary | ICD-10-CM

## 2018-02-12 DIAGNOSIS — M6281 Muscle weakness (generalized): Secondary | ICD-10-CM

## 2018-02-12 DIAGNOSIS — M25561 Pain in right knee: Secondary | ICD-10-CM

## 2018-02-12 DIAGNOSIS — R2689 Other abnormalities of gait and mobility: Secondary | ICD-10-CM

## 2018-02-12 NOTE — Therapy (Signed)
[facility] MAIN [facility] SERVICES [address] [place], [place], [address] Phone: [phone]   Fax:  [phone]  Physical Therapy Treatment  Patient Details  Name: Sarah Friedman MRN: [id number] Date of Birth: Nov 10, 1933 Referring Provider (PT): Candiss Norse , [place]    Encounter Date: 02/12/2018  PT End of Session - 02/12/18 1528    Visit Number  5    Number of Visits  8    Date for PT Re-Evaluation  02/20/18    Authorization Type  5/10 PN start 11/ 14    PT Start Time  1345    PT Stop Time  1430    PT Time Calculation (min)  45 min    Equipment Utilized During Treatment  Gait belt    Activity Tolerance  Patient tolerated treatment well    Behavior During Therapy  [facility] for tasks assessed/performed       Past Medical History:  Diagnosis Date  . Arthritis   . Hypertension   . Vertigo     Past Surgical History:  Procedure Laterality Date  . CATARACT EXTRACTION W/PHACO Right 04/23/2017   Procedure: CATARACT EXTRACTION PHACO AND INTRAOCULAR LENS PLACEMENT (IOC);  Surgeon: Galen Manila, MD;  Location: [facility] ORS;  Service: Ophthalmology;  Laterality: Right;  [place] 00:43.8 AP% 18.3 CDE 8.05 Fluid Pack lot # [id number]  . CATARACT EXTRACTION W/PHACO Left 05/14/2017   Procedure: CATARACT EXTRACTION PHACO AND INTRAOCULAR LENS PLACEMENT (IOC);  Surgeon: Galen Manila, MD;  Location: [facility] ORS;  Service: Ophthalmology;  Laterality: Left;  [place] 00:29.5 AP% 17.2 CDE 5.07 Fluid Pack Lot # [id number]  . JOINT REPLACEMENT Left    TKR  . OVARIAN CYST SURGERY    . TONSILLECTOMY      There were no vitals filed for this visit.  Subjective Assessment - 02/12/18 1349    Subjective  Patient reports she may go on a small road trip this weekend to where she grew up. Has been feeling very stiff during the mornings after waking up due to sleeping with knee bent. Has not been icing.     Pertinent History  Patient is a pleasant 84 year old female who  received a R TKA on 01/09/18. PMH includes HTN, HLD, anemia, osteopenia, right ventricular end diastolic pressure elevation. Patient presents with son, using cane for the first time. Was not walking with an AD prior to surgery. Has a history of L TKA. No pain medication being taken, just Tylenol. Had two week of home health and her son helped her with her home health HEP. Wants to return to playing with grandchildren.     Limitations  Lifting;Standing;Walking;House hold activities;Other (comment)    How long can you sit comfortably?  n/a    How long can you stand comfortably?  hasn't been standing much    How long can you walk comfortably?  only been able to walk 4 rounds of house    Diagnostic tests  sx    Patient Stated Goals  play with grandchildren, return to normal routine     Currently in Pain?  Yes    Pain Score  5     Pain Location  Knee    Pain Orientation  Right    Pain Descriptors / Indicators  Aching    Pain Type  Acute pain    Pain Onset  More than a month ago    Pain Frequency  Intermittent      Exercises for road trip this  weekend:    Access Code: [email or web address]  URL: [email or web address]  Date: 02/12/2018  Prepared by: Precious Bard   Exercises  Seated Hamstring Stretch - 2 reps - 2 sets - 30 hold - 1x daily - 7x weekly  Seated Heel Toe Raises - 10 reps - 2 sets - 5 hold - 1x daily - 7x weekly  Seated March - 10 reps - 2 sets - 5 hold - 1x daily - 7x weekly  Standing Hip Flexor Stretch - 2 reps - 2 sets - 30 hold - 1x daily - 7x weekly  Treatment:  standing weight shift 10x in // bars; focusing onto weight shift onto RLE  Red TB resisted R knee extension 10x to improve knee extension in standing position  Green dynadisc under LLE to promote weight shift to RLE. 2x 60 seconds: visible knee trembling from fatigue   Seated:  Contract relax agonist against PT 10x 5 second holds   Seated AROM: Flexion 120 degrees  Seated: -4   Manual: Patellofemoral  mobilizations: inferior, superior, medial, lateral.30 seconds each direction 4x Tibiofemoral mobilizations AP 10x 5 seconds AP mobilization of R knee 10x 5 second mobilizations  Hamstring stretch 2x60 seconds RLE PROM flexion and extension 3x each direction, hold 10-20 seconds             Ice cup massage 4 minutes                      PT Education - 02/12/18 1527    Education Details  exercise technique, manual, standing interventions, car trip     Person(s) Educated  Patient    Methods  Explanation;Demonstration;Verbal cues    Comprehension  Verbalized understanding;Returned demonstration       PT Short Term Goals - 01/23/18 1522      PT SHORT TERM GOAL #1   Title  Patient will be independent in home exercise program to improve strength/mobility for better functional independence with ADLs    Baseline  compliance with HH HEP     Time  2    Period  Weeks    Status  New    Target Date  02/06/18      PT SHORT TERM GOAL #2   Title  Patient will perform sit to stand without UE suport to demonstrate improved LE strength and stability     Baseline  11/14: require BUE support     Time  2    Period  Weeks    Status  New    Target Date  02/06/18      PT SHORT TERM GOAL #3   Title  Patient will increase R knee AROM to 105 degrees flexion and -5 degrees extension for improved mobility and gait mechanics    Baseline  11/14: AROM: 95 flexion extension -10.     Time  2    Period  Weeks    Status  New    Target Date  02/06/18      PT SHORT TERM GOAL #4   Title  Patient will increase R knee strength to 3/5 as to improve functional strength for independent gait, increased standing tolerance and increased ADL ability.    Baseline  11/14: 2+/5     Time  2    Period  Weeks    Status  New    Target Date  02/06/18        PT Long Term Goals - 01/23/18 1525  PT LONG TERM GOAL #1   Title  Patient will increase R LE strength  to 4+/5 as to improve functional strength for independent gait, increased standing tolerance and increased ADL ability.    Baseline  11/14: R knee 2+/5    Time  4    Period  Weeks    Status  New    Target Date  02/20/18      PT LONG TERM GOAL #2   Title  Patient will report a worst pain of 3/10 on VAS in  R knee  to improve tolerance with ADLs and reduced symptoms with activities.     Baseline  11/14: 7/10 worst pain     Time  4    Period  Weeks    Status  New    Target Date  02/20/18      PT LONG TERM GOAL #3   Title  Patient will increase R knee AROM to 120 degrees flexion and neutral extension for return to PLOF and natural ambulatory mechanics.     Baseline  11/14: R knee 95 flexion -10 extension     Time  4    Period  Weeks    Status  New    Target Date  02/20/18      PT LONG TERM GOAL #4   Title  Patient (> 47 years old) will complete five times sit to stand test in < 15 seconds indicating an increased LE strength and improved balance.    Baseline  11/14: 22 seconds with excessive UE support and R knee forward     Time  4    Period  Weeks    Status  New    Target Date  02/20/18      PT LONG TERM GOAL #5   Title  Patient will increase lower extremity functional scale to >60/80 to demonstrate improved functional mobility and increased tolerance with ADLs.     Baseline  11/14: 40/80     Time  4    Period  Weeks    Status  New    Target Date  02/20/18      Additional Long Term Goals   Additional Long Term Goals  Yes      PT LONG TERM GOAL #6   Title  Patient will increase 10 meter walk test to >1.25m/s with least restrictive device to improve gait speed for better community ambulation and to reduce fall risk.    Baseline  11/14: .71 m/s with cane    Time  4    Period  Weeks    Status  New    Target Date  02/20/18            Plan - 02/12/18 1532    Clinical Impression Statement  Patient demonstrated understanding of car exercise program and need to get out of  car every hour to hour and half when on her car trip this weekend. Patient introduced into standing interventions to promote more equal weight shift and weightbearing for functional mobility. Patient will benefit from skilled physical therapy to reduce pain, improve ROM and strength, and increase mobility for return to PLOF.    Rehab Potential  Good    Clinical Impairments Affecting Rehab Potential  (+) family support, prior level of function, high pain tolerance (-) age    PT Frequency  2x / week    PT Duration  4 weeks    PT Treatment/Interventions  ADLs/Self Care Home Management;Aquatic Therapy;Cryotherapy;Electrical Stimulation;Iontophoresis  4mg /ml Dexamethasone;Moist Heat;Traction;Ultrasound;DME Instruction;Balance training;Therapeutic exercise;Therapeutic activities;Functional mobility training;Stair training;Gait training;Neuromuscular re-education;Patient/family education;Scar mobilization;Compression bandaging;Manual lymph drainage;Manual techniques;Passive range of motion;Dry needling;Energy conservation;Splinting;Taping    PT Next Visit Plan  ROM, strength     PT Home Exercise Plan  continue HEP     Consulted and Agree with Plan of Care  Patient;Family member/caregiver    Family Member Consulted  son       Patient will benefit from skilled therapeutic intervention in order to improve the following deficits and impairments:  Abnormal gait, Decreased activity tolerance, Decreased balance, Decreased endurance, Decreased coordination, Decreased mobility, Decreased range of motion, Decreased scar mobility, Decreased strength, Difficulty walking, Increased edema, Hypomobility, Impaired perceived functional ability, Impaired flexibility, Improper body mechanics, Postural dysfunction, Pain  Visit Diagnosis: Stiffness of right knee, not elsewhere classified  Acute pain of right knee  Other abnormalities of gait and mobility  Muscle weakness (generalized)     Problem List There are no  active problems to display for this patient.   Precious BardMarina Carol Loftin, PT, DPT   02/12/2018, 3:33 PM  [place] [facility] MAIN [facility] SERVICES [address] [place], [place], [address] Phone: [phone]   Fax:  [phone]  Name: Sarah Friedman MRN: [id number] Date of Birth: 05/13/1933

## 2018-02-17 ENCOUNTER — Ambulatory Visit: Payer: Medicare Other

## 2018-02-17 DIAGNOSIS — R2689 Other abnormalities of gait and mobility: Secondary | ICD-10-CM

## 2018-02-17 DIAGNOSIS — M25661 Stiffness of right knee, not elsewhere classified: Secondary | ICD-10-CM | POA: Diagnosis not present

## 2018-02-17 DIAGNOSIS — M6281 Muscle weakness (generalized): Secondary | ICD-10-CM

## 2018-02-17 DIAGNOSIS — M25561 Pain in right knee: Secondary | ICD-10-CM

## 2018-02-17 NOTE — Therapy (Signed)
[facility] MAIN [facility] SERVICES [address] [place], [place], [address] Phone: [phone]   Fax:  [phone]  Physical Therapy Treatment  Patient Details  Name: Sarah Friedman MRN: [id number] Date of Birth: 1934/01/16 Referring Provider (PT): Candiss Norse , [place]    Encounter Date: 02/17/2018  PT End of Session - 02/17/18 1352    Visit Number  6    Number of Visits  8    Date for PT Re-Evaluation  02/20/18    Authorization Type  6/10 PN start 11/ 14    PT Start Time  1345    PT Stop Time  1430    PT Time Calculation (min)  45 min    Equipment Utilized During Treatment  Gait belt    Activity Tolerance  Patient tolerated treatment well    Behavior During Therapy  [facility] for tasks assessed/performed       Past Medical History:  Diagnosis Date  . Arthritis   . Hypertension   . Vertigo     Past Surgical History:  Procedure Laterality Date  . CATARACT EXTRACTION W/PHACO Right 04/23/2017   Procedure: CATARACT EXTRACTION PHACO AND INTRAOCULAR LENS PLACEMENT (IOC);  Surgeon: Galen Manila, MD;  Location: [facility] ORS;  Service: Ophthalmology;  Laterality: Right;  [place] 00:43.8 AP% 18.3 CDE 8.05 Fluid Pack lot # [id number] H  . CATARACT EXTRACTION W/PHACO Left 05/14/2017   Procedure: CATARACT EXTRACTION PHACO AND INTRAOCULAR LENS PLACEMENT (IOC);  Surgeon: Galen Manila, MD;  Location: [facility] ORS;  Service: Ophthalmology;  Laterality: Left;  [place] 00:29.5 AP% 17.2 CDE 5.07 Fluid Pack Lot # [id number]  . JOINT REPLACEMENT Left    TKR  . OVARIAN CYST SURGERY    . TONSILLECTOMY      There were no vitals filed for this visit.  Subjective Assessment - 02/17/18 1350    Subjective  Patient reports she is very stiff this morning after waking up. Having soreness when waking up that gets better throughout the day, thinks she sleeps with knees flexed.  Didn't end up going on her road trip.     Pertinent History  Patient is a pleasant 84 year old female  who received a R TKA on 01/09/18. PMH includes HTN, HLD, anemia, osteopenia, right ventricular end diastolic pressure elevation. Patient presents with son, using cane for the first time. Was not walking with an AD prior to surgery. Has a history of L TKA. No pain medication being taken, just Tylenol. Had two week of home health and her son helped her with her home health HEP. Wants to return to playing with grandchildren.     Limitations  Lifting;Standing;Walking;House hold activities;Other (comment)    How long can you sit comfortably?  n/a    How long can you stand comfortably?  hasn't been standing much    How long can you walk comfortably?  only been able to walk 4 rounds of house    Diagnostic tests  sx    Patient Stated Goals  play with grandchildren, return to normal routine     Currently in Pain?  Yes    Pain Score  6     Pain Location  Knee    Pain Orientation  Right    Pain Descriptors / Indicators  Aching    Pain Type  Surgical pain    Pain Onset  More than a month ago    Pain Frequency  Intermittent         Treatment:  Airex  pad: eyes closed 30 seconds x 3 trials  Red TB resisted R knee extension 10x to improve knee extension in standing position   Green dynadisc under LLE to promote weight shift to RLE. 2x 60 seconds: visible knee trembling from fatigue    Ambulate 96 ft with cane to focus on step length and body mechanics.   Sit to stand 10x cues for extending knees upon full standing position   Seated:  Contract relax agonist against PT 10x 5 second holds    Seated AROM: Flexion 120 degrees  Seated: -4   Manual: Patellofemoral mobilizations: inferior, superior, medial, lateral. 30 seconds each direction 4x   Tibiofemoral mobilizations AP 10x 5 seconds             AP mobilization of R knee 10x 5 second mobilizations              Hamstring stretch 2x60 seconds RLE             PROM flexion and extension 3x each direction, hold 10-20 seconds             Ice cup  massage 4 minutes   Scar tissue massage 2 minutes with cross friction.                          PT Education - 02/17/18 1352    Education Details  exercise technique, manual, standing interventions.     Person(s) Educated  Patient    Methods  Explanation;Demonstration;Verbal cues    Comprehension  Verbalized understanding;Returned demonstration;Need further instruction       PT Short Term Goals - 01/23/18 1522      PT SHORT TERM GOAL #1   Title  Patient will be independent in home exercise program to improve strength/mobility for better functional independence with ADLs    Baseline  compliance with HH HEP     Time  2    Period  Weeks    Status  New    Target Date  02/06/18      PT SHORT TERM GOAL #2   Title  Patient will perform sit to stand without UE suport to demonstrate improved LE strength and stability     Baseline  11/14: require BUE support     Time  2    Period  Weeks    Status  New    Target Date  02/06/18      PT SHORT TERM GOAL #3   Title  Patient will increase R knee AROM to 105 degrees flexion and -5 degrees extension for improved mobility and gait mechanics    Baseline  11/14: AROM: 95 flexion extension -10.     Time  2    Period  Weeks    Status  New    Target Date  02/06/18      PT SHORT TERM GOAL #4   Title  Patient will increase R knee strength to 3/5 as to improve functional strength for independent gait, increased standing tolerance and increased ADL ability.    Baseline  11/14: 2+/5     Time  2    Period  Weeks    Status  New    Target Date  02/06/18        PT Long Term Goals - 01/23/18 1525      PT LONG TERM GOAL #1   Title  Patient will increase R LE strength to 4+/5 as to improve functional  strength for independent gait, increased standing tolerance and increased ADL ability.    Baseline  11/14: R knee 2+/5    Time  4    Period  Weeks    Status  New    Target Date  02/20/18      PT LONG TERM GOAL #2   Title   Patient will report a worst pain of 3/10 on VAS in  R knee  to improve tolerance with ADLs and reduced symptoms with activities.     Baseline  11/14: 7/10 worst pain     Time  4    Period  Weeks    Status  New    Target Date  02/20/18      PT LONG TERM GOAL #3   Title  Patient will increase R knee AROM to 120 degrees flexion and neutral extension for return to PLOF and natural ambulatory mechanics.     Baseline  11/14: R knee 95 flexion -10 extension     Time  4    Period  Weeks    Status  New    Target Date  02/20/18      PT LONG TERM GOAL #4   Title  Patient (> 82 years old) will complete five times sit to stand test in < 15 seconds indicating an increased LE strength and improved balance.    Baseline  11/14: 22 seconds with excessive UE support and R knee forward     Time  4    Period  Weeks    Status  New    Target Date  02/20/18      PT LONG TERM GOAL #5   Title  Patient will increase lower extremity functional scale to >60/80 to demonstrate improved functional mobility and increased tolerance with ADLs.     Baseline  11/14: 40/80     Time  4    Period  Weeks    Status  New    Target Date  02/20/18      Additional Long Term Goals   Additional Long Term Goals  Yes      PT LONG TERM GOAL #6   Title  Patient will increase 10 meter walk test to >1.1345m/s with least restrictive device to improve gait speed for better community ambulation and to reduce fall risk.    Baseline  11/14: .71 m/s with cane    Time  4    Period  Weeks    Status  New    Target Date  02/20/18            Plan - 02/17/18 1452    Clinical Impression Statement  Patient arrived to session with excessive stiffness initially that was resolved through combined manual and there ex. Patient continues to be challenged fully extending RLE in stance phase as well as in weightbearing due to fatigue. Patient educated on need for scar tissue massage and icing.  Patient will benefit from skilled physical therapy  to reduce pain, improve ROM and strength, and increase mobility for return to PLOF.    Rehab Potential  Good    Clinical Impairments Affecting Rehab Potential  (+) family support, prior level of function, high pain tolerance (-) age    PT Frequency  2x / week    PT Duration  4 weeks    PT Treatment/Interventions  ADLs/Self Care Home Management;Aquatic Therapy;Cryotherapy;Electrical Stimulation;Iontophoresis 4mg /ml Dexamethasone;Moist Heat;Traction;Ultrasound;DME Instruction;Balance training;Therapeutic exercise;Therapeutic activities;Functional mobility training;Stair training;Gait training;Neuromuscular re-education;Patient/family education;Scar mobilization;Compression bandaging;Manual lymph drainage;Manual  techniques;Passive range of motion;Dry needling;Energy conservation;Splinting;Taping    PT Next Visit Plan  ROM, strength     PT Home Exercise Plan  continue HEP     Consulted and Agree with Plan of Care  Patient;Family member/caregiver    Family Member Consulted  son       Patient will benefit from skilled therapeutic intervention in order to improve the following deficits and impairments:  Abnormal gait, Decreased activity tolerance, Decreased balance, Decreased endurance, Decreased coordination, Decreased mobility, Decreased range of motion, Decreased scar mobility, Decreased strength, Difficulty walking, Increased edema, Hypomobility, Impaired perceived functional ability, Impaired flexibility, Improper body mechanics, Postural dysfunction, Pain  Visit Diagnosis: Stiffness of right knee, not elsewhere classified  Acute pain of right knee  Other abnormalities of gait and mobility  Muscle weakness (generalized)     Problem List There are no active problems to display for this patient.  Precious Bard, PT, DPT   02/17/2018, 2:53 PM  [place] [facility] MAIN [facility] SERVICES [address] [place], [place], [address] Phone: [phone]   Fax:   [phone]  Name: Sarah Friedman MRN: [id number] Date of Birth: 10-17-1933

## 2018-02-19 ENCOUNTER — Ambulatory Visit: Payer: Medicare Other

## 2018-02-19 DIAGNOSIS — M25561 Pain in right knee: Secondary | ICD-10-CM

## 2018-02-19 DIAGNOSIS — R2689 Other abnormalities of gait and mobility: Secondary | ICD-10-CM

## 2018-02-19 DIAGNOSIS — M25661 Stiffness of right knee, not elsewhere classified: Secondary | ICD-10-CM | POA: Diagnosis not present

## 2018-02-19 DIAGNOSIS — M6281 Muscle weakness (generalized): Secondary | ICD-10-CM

## 2018-02-19 NOTE — Therapy (Signed)
[place] [facility] MAIN [facility] SERVICES [address] [place], [place], [address] Phone: [phone]   Fax:  [phone]  Physical Therapy Treatment  Patient Details  Name: Sarah Friedman MRN: [id number] Date of Birth: Oct 03, 1933 Referring Provider (PT): Candiss Norse , [place]    Encounter Date: 02/19/2018  PT End of Session - 02/19/18 1349    Visit Number  7    Number of Visits  8    Date for PT Re-Evaluation  02/20/18    Authorization Type  7/10 PN start 11/ 14    PT Start Time  1345    PT Stop Time  1430    PT Time Calculation (min)  45 min    Equipment Utilized During Treatment  Gait belt    Activity Tolerance  Patient tolerated treatment well    Behavior During Therapy  WFL for tasks assessed/performed       Past Medical History:  Diagnosis Date  . Arthritis   . Hypertension   . Vertigo     Past Surgical History:  Procedure Laterality Date  . CATARACT EXTRACTION W/PHACO Right 04/23/2017   Procedure: CATARACT EXTRACTION PHACO AND INTRAOCULAR LENS PLACEMENT (IOC);  Surgeon: Galen Manila, MD;  Location: [facility] ORS;  Service: Ophthalmology;  Laterality: Right;  [place] 00:43.8 AP% 18.3 CDE 8.05 Fluid Pack lot # [id number] H  . CATARACT EXTRACTION W/PHACO Left 05/14/2017   Procedure: CATARACT EXTRACTION PHACO AND INTRAOCULAR LENS PLACEMENT (IOC);  Surgeon: Galen Manila, MD;  Location: [facility] ORS;  Service: Ophthalmology;  Laterality: Left;  [place] 00:29.5 AP% 17.2 CDE 5.07 Fluid Pack Lot # [id number]  . JOINT REPLACEMENT Left    TKR  . OVARIAN CYST SURGERY    . TONSILLECTOMY      There were no vitals filed for this visit.  Subjective Assessment - 02/19/18 1347    Subjective  Patient reports she got cold last night and "scrunched up" resulting in poor sleep and extra pain. Has not been icing her knee due to "being busy"     Pertinent History  Patient is a pleasant 84 year old female who received a R TKA on 01/09/18. PMH includes HTN, HLD,  anemia, osteopenia, right ventricular end diastolic pressure elevation. Patient presents with son, using cane for the first time. Was not walking with an AD prior to surgery. Has a history of L TKA. No pain medication being taken, just Tylenol. Had two week of home health and her son helped her with her home health HEP. Wants to return to playing with grandchildren.     Limitations  Lifting;Standing;Walking;House hold activities;Other (comment)    How long can you sit comfortably?  n/a    How long can you stand comfortably?  hasn't been standing much    How long can you walk comfortably?  only been able to walk 4 rounds of house    Diagnostic tests  sx    Patient Stated Goals  play with grandchildren, return to normal routine     Currently in Pain?  Yes    Pain Score  6     Pain Location  Knee    Pain Orientation  Right    Pain Descriptors / Indicators  Aching    Pain Type  Surgical pain    Pain Onset  More than a month ago    Pain Frequency  Intermittent          Treatment:  Standing R calf stretch 2x 30 seconds on stairs Ambulate  without AD, 160 ft: 2 trials one prior to manual and one trial after manual. Improved gait mechanics with decreased knee flexion and improved stride length. No episodes of instability    Manual: Patellofemoral mobilizations: inferior, superior, medial, lateral. 30 seconds each direction 4x   Tibiofemoral mobilizations AP 10x 5 seconds             Hamstring stretch 2x60 seconds RLE             PROM flexion and extension 6x each direction, hold 10-20 seconds       Prone: R hip flexion stretch 2x 30 seconds                Scar tissue massage 4 minutes with cross friction.    Medial quadriceps and adductor STM 2 minutes; noted muscle tissue length limitations/ trigger points                      PT Education - 02/19/18 1348    Education Details  exercise technique, manual, ambulating without cane.     Person(s) Educated  Patient     Methods  Explanation;Demonstration;Verbal cues    Comprehension  Verbalized understanding;Returned demonstration;Need further instruction;Verbal cues required       PT Short Term Goals - 01/23/18 1522      PT SHORT TERM GOAL #1   Title  Patient will be independent in home exercise program to improve strength/mobility for better functional independence with ADLs    Baseline  compliance with HH HEP     Time  2    Period  Weeks    Status  New    Target Date  02/06/18      PT SHORT TERM GOAL #2   Title  Patient will perform sit to stand without UE suport to demonstrate improved LE strength and stability     Baseline  11/14: require BUE support     Time  2    Period  Weeks    Status  New    Target Date  02/06/18      PT SHORT TERM GOAL #3   Title  Patient will increase R knee AROM to 105 degrees flexion and -5 degrees extension for improved mobility and gait mechanics    Baseline  11/14: AROM: 95 flexion extension -10.     Time  2    Period  Weeks    Status  New    Target Date  02/06/18      PT SHORT TERM GOAL #4   Title  Patient will increase R knee strength to 3/5 as to improve functional strength for independent gait, increased standing tolerance and increased ADL ability.    Baseline  11/14: 2+/5     Time  2    Period  Weeks    Status  New    Target Date  02/06/18        PT Long Term Goals - 01/23/18 1525      PT LONG TERM GOAL #1   Title  Patient will increase R LE strength to 4+/5 as to improve functional strength for independent gait, increased standing tolerance and increased ADL ability.    Baseline  11/14: R knee 2+/5    Time  4    Period  Weeks    Status  New    Target Date  02/20/18      PT LONG TERM GOAL #2   Title  Patient will report  a worst pain of 3/10 on VAS in  R knee  to improve tolerance with ADLs and reduced symptoms with activities.     Baseline  11/14: 7/10 worst pain     Time  4    Period  Weeks    Status  New    Target Date  02/20/18       PT LONG TERM GOAL #3   Title  Patient will increase R knee AROM to 120 degrees flexion and neutral extension for return to PLOF and natural ambulatory mechanics.     Baseline  11/14: R knee 95 flexion -10 extension     Time  4    Period  Weeks    Status  New    Target Date  02/20/18      PT LONG TERM GOAL #4   Title  Patient (> 96 years old) will complete five times sit to stand test in < 15 seconds indicating an increased LE strength and improved balance.    Baseline  11/14: 22 seconds with excessive UE support and R knee forward     Time  4    Period  Weeks    Status  New    Target Date  02/20/18      PT LONG TERM GOAL #5   Title  Patient will increase lower extremity functional scale to >60/80 to demonstrate improved functional mobility and increased tolerance with ADLs.     Baseline  11/14: 40/80     Time  4    Period  Weeks    Status  New    Target Date  02/20/18      Additional Long Term Goals   Additional Long Term Goals  Yes      PT LONG TERM GOAL #6   Title  Patient will increase 10 meter walk test to >1.62m/s with least restrictive device to improve gait speed for better community ambulation and to reduce fall risk.    Baseline  11/14: .71 m/s with cane    Time  4    Period  Weeks    Status  New    Target Date  02/20/18            Plan - 02/19/18 1440    Clinical Impression Statement   Patient's goals will be assessed next session rather than this session due to patient's higher pain levels/ fatigue. Patient's gait mechanics are improved after manual with increased knee extension and stance phase upon RLE. Patient educated on weaning from cane to promote return to PLOF. Patient will benefit from skilled physical therapy to reduce pain, improve ROM and strength, and increase mobility for return to PLOF.    Rehab Potential  Good    Clinical Impairments Affecting Rehab Potential  (+) family support, prior level of function, high pain tolerance (-) age    PT  Frequency  2x / week    PT Duration  4 weeks    PT Treatment/Interventions  ADLs/Self Care Home Management;Aquatic Therapy;Cryotherapy;Electrical Stimulation;Iontophoresis 4mg /ml Dexamethasone;Moist Heat;Traction;Ultrasound;DME Instruction;Balance training;Therapeutic exercise;Therapeutic activities;Functional mobility training;Stair training;Gait training;Neuromuscular re-education;Patient/family education;Scar mobilization;Compression bandaging;Manual lymph drainage;Manual techniques;Passive range of motion;Dry needling;Energy conservation;Splinting;Taping    PT Next Visit Plan  ROM, strength     PT Home Exercise Plan  continue HEP     Consulted and Agree with Plan of Care  Patient;Family member/caregiver    Family Member Consulted  son       Patient will benefit from skilled therapeutic intervention in  order to improve the following deficits and impairments:  Abnormal gait, Decreased activity tolerance, Decreased balance, Decreased endurance, Decreased coordination, Decreased mobility, Decreased range of motion, Decreased scar mobility, Decreased strength, Difficulty walking, Increased edema, Hypomobility, Impaired perceived functional ability, Impaired flexibility, Improper body mechanics, Postural dysfunction, Pain  Visit Diagnosis: Stiffness of right knee, not elsewhere classified  Acute pain of right knee  Other abnormalities of gait and mobility  Muscle weakness (generalized)     Problem List There are no active problems to display for this patient.  Precious Bard, PT, DPT   02/19/2018, 2:42 PM  [place] [facility] MAIN [facility] SERVICES [address] [place], [place], [address] Phone: [phone]   Fax:  [phone]  Name: Sarah Friedman MRN: [id number] Date of Birth: Aug 04, 1933

## 2018-02-24 ENCOUNTER — Ambulatory Visit: Payer: Medicare Other

## 2018-02-24 DIAGNOSIS — R2689 Other abnormalities of gait and mobility: Secondary | ICD-10-CM

## 2018-02-24 DIAGNOSIS — M25661 Stiffness of right knee, not elsewhere classified: Secondary | ICD-10-CM | POA: Diagnosis not present

## 2018-02-24 DIAGNOSIS — M25561 Pain in right knee: Secondary | ICD-10-CM

## 2018-02-24 DIAGNOSIS — M6281 Muscle weakness (generalized): Secondary | ICD-10-CM

## 2018-02-24 NOTE — Therapy (Signed)
[place] MAIN [facility] SERVICES [address] [place], [place], [address] Phone: [phone]   Fax:  [phone]  Physical Therapy Treatment Physical Therapy Progress Note/ RECERT   Dates of reporting period  01/23/18  to   02/24/18   Patient Details  Name: Sarah Friedman MRN: [id number] Date of Birth: 09-28-1933 Referring Provider (PT): Priscille Kluver , [place]    Encounter Date: 02/24/2018  PT End of Session - 02/24/18 1353    Visit Number  8    Number of Visits  16    Date for PT Re-Evaluation  03/24/18    Authorization Type  8/10 PN start 11/ 14 (next 1/10 start 12/16)    PT Start Time  1345    PT Stop Time  1429    PT Time Calculation (min)  44 min    Equipment Utilized During Treatment  Gait belt    Activity Tolerance  Patient tolerated treatment well    Behavior During Therapy  WFL for tasks assessed/performed       Past Medical History:  Diagnosis Date  . Arthritis   . Hypertension   . Vertigo     Past Surgical History:  Procedure Laterality Date  . CATARACT EXTRACTION W/PHACO Right 04/23/2017   Procedure: CATARACT EXTRACTION PHACO AND INTRAOCULAR LENS PLACEMENT (IOC);  Surgeon: Birder Robson, MD;  Location: [facility] ORS;  Service: Ophthalmology;  Laterality: Right;  [place] 00:43.8 AP% 18.3 CDE 8.05 Fluid Pack lot # [id number] H  . CATARACT EXTRACTION W/PHACO Left 05/14/2017   Procedure: CATARACT EXTRACTION PHACO AND INTRAOCULAR LENS PLACEMENT (IOC);  Surgeon: Birder Robson, MD;  Location: [facility] ORS;  Service: Ophthalmology;  Laterality: Left;  [place] 00:29.5 AP% 17.2 CDE 5.07 Fluid Pack Lot # [id number] H  . JOINT REPLACEMENT Left    TKR  . OVARIAN CYST SURGERY    . TONSILLECTOMY      There were no vitals filed for this visit.  Subjective Assessment - 02/24/18 1349    Subjective  Patient reports she went on a trip to her previous home which is about 400 miles and got back last night so is feeling very sore from the drive. Put  flowers on her families graves all day.     Pertinent History  Patient is a pleasant 84 year old female who received a R TKA on 01/09/18. PMH includes HTN, HLD, anemia, osteopenia, right ventricular end diastolic pressure elevation. Patient presents with son, using cane for the first time. Was not walking with an AD prior to surgery. Has a history of L TKA. No pain medication being taken, just Tylenol. Had two week of home health and her son helped her with her home health HEP. Wants to return to playing with grandchildren.     Limitations  Lifting;Standing;Walking;House hold activities;Other (comment)    How long can you sit comfortably?  n/a    How long can you stand comfortably?  hasn't been standing much    How long can you walk comfortably?  only been able to walk 4 rounds of house    Diagnostic tests  sx    Patient Stated Goals  play with grandchildren, return to normal routine     Currently in Pain?  Yes    Pain Score  4     Pain Location  Knee    Pain Orientation  Right    Pain Descriptors / Indicators  Aching    Pain Type  Surgical pain    Pain Onset  More than a month ago    Pain Frequency  Intermittent    Aggravating Factors   not moving for a long time       Patient's condition has the potential to improve in response to therapy. Maximum improvement is yet to be obtained. The anticipated improvement is attainable and reasonable in a generally predictable time.  Patient reports she was able to put flowers on her families graves independently, is having more pain in the mornings after sleeping " scrunched up".     HEP: compliant R knee strength:4-/5  VAS: 5/10 worst pain 5x STS : 14 seconds.  LEFS: 35/80 10 MWT: 12 seconds without canes =.83 m/s  Add 6 MWT: 798 ft with and without cane.      Manual: Patellofemoral mobilizations: inferior, superior, medial, lateral.30 seconds each direction 4x Hamstring stretch 2x60 seconds RLE PROM flexion and  extension 6x each direction, hold 10-20 seconds  Adductor stretch 2x 60 seconds in hooklying RLE Scar tissue massage 4 minutes with cross friction.              Medial quadriceps and adductor STM 2 minutes; noted muscle tissue length limitations/ trigger points                     PT Education - 02/24/18 1352    Education Details  exercise technique, goals, POC, ambulating without cane    Person(s) Educated  Patient    Methods  Explanation;Demonstration;Verbal cues;Tactile cues    Comprehension  Verbalized understanding;Returned demonstration;Tactile cues required;Need further instruction;Verbal cues required       PT Short Term Goals - 02/24/18 1610      PT SHORT TERM GOAL #1   Title  Patient will be independent in home exercise program to improve strength/mobility for better functional independence with ADLs    Baseline  compliance with HH HEP 12/16: compliant    Time  2    Period  Weeks    Status  Achieved      PT SHORT TERM GOAL #2   Title  Patient will perform sit to stand without UE suport to demonstrate improved LE strength and stability     Baseline  11/14: require BUE support 12/16: requires BUE support     Time  2    Period  Weeks    Status  On-going    Target Date  03/10/18      PT SHORT TERM GOAL #3   Title  Patient will increase R knee AROM to 105 degrees flexion and -5 degrees extension for improved mobility and gait mechanics    Baseline  11/14: AROM: 95 flexion extension -10. 12/16: flexion 120, extension -3     Time  2    Period  Weeks    Status  Achieved      PT SHORT TERM GOAL #4   Title  Patient will increase R knee strength to 3/5 as to improve functional strength for independent gait, increased standing tolerance and increased ADL ability.    Baseline  11/14: 2+/5 12/16: 4-/5     Time  2    Period  Weeks    Status  Achieved        PT Long Term Goals - 02/24/18 1401      PT LONG TERM GOAL #1   Title  Patient  will increase R LE strength to 4+/5 as to improve functional strength for independent gait, increased standing tolerance and increased ADL ability.  Baseline  11/14: R knee 2+/5 12/16: 4-/5     Time  4    Period  Weeks    Status  Partially Met    Target Date  03/24/18      PT LONG TERM GOAL #2   Title  Patient will report a worst pain of 3/10 on VAS in  R knee  to improve tolerance with ADLs and reduced symptoms with activities.     Baseline  11/14: 7/10 worst pain  12/16: 5/10     Time  4    Period  Weeks    Status  Partially Met    Target Date  03/24/18      PT LONG TERM GOAL #3   Title  Patient will increase R knee AROM to 120 degrees flexion and neutral extension for return to PLOF and natural ambulatory mechanics.     Baseline  11/14: R knee 95 flexion -10 extension 12/16: 120 flexion, -3 extension after stretch     Time  4    Period  Weeks    Status  Partially Met    Target Date  03/24/18      PT LONG TERM GOAL #4   Title  Patient (> 38 years old) will complete five times sit to stand test in < 15 seconds indicating an increased LE strength and improved balance.    Baseline  11/14: 22 seconds with excessive UE support and R knee forward 12/16: 14 seconds with BUE support     Time  4    Period  Weeks    Status  Partially Met    Target Date  03/24/18      PT LONG TERM GOAL #5   Title  Patient will increase lower extremity functional scale to >60/80 to demonstrate improved functional mobility and increased tolerance with ADLs.     Baseline  11/14: 40/80 12/16 : 35/80     Time  4    Period  Weeks    Status  On-going    Target Date  03/24/18      Additional Long Term Goals   Additional Long Term Goals  Yes      PT LONG TERM GOAL #6   Title  Patient will increase 10 meter walk test to >1.29ms with least restrictive device to improve gait speed for better community ambulation and to reduce fall risk.    Baseline  11/14: .71 m/s with cane 12/16: .83 m/s without cane     Time  4    Period  Weeks    Status  Partially Met    Target Date  03/24/18      PT LONG TERM GOAL #7   Title  Patient will increase six minute walk test distance to >1000 for progression to community ambulator and improve gait ability    Baseline  12/16: 798 ft with and without cane    Time  4    Period  Weeks    Status  New    Target Date  03/24/18            Plan - 02/24/18 1610    Clinical Impression Statement  Patient progressing with functional goals, demonstrating improved ROM and strength. Patient demonstrated ability to ambulate short distances without cane indicating improved gait mechanics and stability. Patient continues to be challenged with prolonged muscle recruitment as well as initial muscle length as patient requires extensive warm up to improve range of motion and fluidity of movement.  Patient's condition has the potential to improve in response to therapy. Maximum improvement is yet to be obtained. The anticipated improvement is attainable and reasonable in a generally predictable time.  Patient will benefit from skilled physical therapy to reduce pain, improve ROM and strength, and increase mobility for return to PLOF.    Rehab Potential  Good    Clinical Impairments Affecting Rehab Potential  (+) family support, prior level of function, high pain tolerance (-) age    PT Frequency  2x / week    PT Duration  4 weeks    PT Treatment/Interventions  ADLs/Self Care Home Management;Aquatic Therapy;Cryotherapy;Electrical Stimulation;Iontophoresis 20m/ml Dexamethasone;Moist Heat;Traction;Ultrasound;DME Instruction;Balance training;Therapeutic exercise;Therapeutic activities;Functional mobility training;Stair training;Gait training;Neuromuscular re-education;Patient/family education;Scar mobilization;Compression bandaging;Manual lymph drainage;Manual techniques;Passive range of motion;Dry needling;Energy conservation;Splinting;Taping    PT Next Visit Plan  ROM, strength     PT  Home Exercise Plan  continue HEP     Consulted and Agree with Plan of Care  Patient;Family member/caregiver    Family Member Consulted  son       Patient will benefit from skilled therapeutic intervention in order to improve the following deficits and impairments:  Abnormal gait, Decreased activity tolerance, Decreased balance, Decreased endurance, Decreased coordination, Decreased mobility, Decreased range of motion, Decreased scar mobility, Decreased strength, Difficulty walking, Increased edema, Hypomobility, Impaired perceived functional ability, Impaired flexibility, Improper body mechanics, Postural dysfunction, Pain  Visit Diagnosis: Stiffness of right knee, not elsewhere classified  Acute pain of right knee  Other abnormalities of gait and mobility  Muscle weakness (generalized)     Problem List There are no active problems to display for this patient.  MJanna Arch PT, DPT   02/24/2018, 4:12 PM  [place] [facility] [address] [address]Phone: [phone]  Fax:  [phone] Name: Sarah NAVARRETTEMRN: [id number] of Birth: [birth year]

## 2018-02-26 ENCOUNTER — Ambulatory Visit: Payer: Medicare Other

## 2018-03-03 ENCOUNTER — Ambulatory Visit: Payer: Medicare Other

## 2018-03-06 ENCOUNTER — Ambulatory Visit: Payer: Medicare Other

## 2018-03-06 DIAGNOSIS — M25561 Pain in right knee: Secondary | ICD-10-CM

## 2018-03-06 DIAGNOSIS — M25661 Stiffness of right knee, not elsewhere classified: Secondary | ICD-10-CM

## 2018-03-06 DIAGNOSIS — R2689 Other abnormalities of gait and mobility: Secondary | ICD-10-CM

## 2018-03-06 DIAGNOSIS — M6281 Muscle weakness (generalized): Secondary | ICD-10-CM

## 2018-03-06 NOTE — Therapy (Signed)
[place] MAIN [facility] SERVICES [address] [place], [place], [address] Phone: [phone]   Fax:  [phone]  Physical Therapy Treatment  Patient Details  Name: Sarah Friedman MRN: [id number] Date of Birth: 1933-06-29 Referring Provider (PT): Priscille Kluver , [place]    Encounter Date: 03/06/2018  PT End of Session - 03/06/18 1352    Visit Number  9    Number of Visits  16    Date for PT Re-Evaluation  03/24/18    Authorization Type   1/10 start 12/16    PT Start Time  1345    PT Stop Time  1430    PT Time Calculation (min)  45 min    Equipment Utilized During Treatment  Gait belt    Activity Tolerance  Patient tolerated treatment well    Behavior During Therapy  [facility] for tasks assessed/performed       Past Medical History:  Diagnosis Date  . Arthritis   . Hypertension   . Vertigo     Past Surgical History:  Procedure Laterality Date  . CATARACT EXTRACTION W/PHACO Right 04/23/2017   Procedure: CATARACT EXTRACTION PHACO AND INTRAOCULAR LENS PLACEMENT (IOC);  Surgeon: Birder Robson, MD;  Location: [facility] ORS;  Service: Ophthalmology;  Laterality: Right;  [place] 00:43.8 AP% 18.3 CDE 8.05 Fluid Pack lot # [id number] H  . CATARACT EXTRACTION W/PHACO Left 05/14/2017   Procedure: CATARACT EXTRACTION PHACO AND INTRAOCULAR LENS PLACEMENT (IOC);  Surgeon: Birder Robson, MD;  Location: [facility] ORS;  Service: Ophthalmology;  Laterality: Left;  [place] 00:29.5 AP% 17.2 CDE 5.07 Fluid Pack Lot # [id number]  . JOINT REPLACEMENT Left    TKR  . OVARIAN CYST SURGERY    . TONSILLECTOMY      There were no vitals filed for this visit.  Subjective Assessment - 03/06/18 1350    Subjective  Patient reports she has been sick and hasn't been able to do her exercises due to not feeling well. Missed last session due to feeling well.     Pertinent History  Patient is a pleasant 84 year old female who received a R TKA on 01/09/18. PMH includes HTN, HLD, anemia,  osteopenia, right ventricular end diastolic pressure elevation. Patient presents with son, using cane for the first time. Was not walking with an AD prior to surgery. Has a history of L TKA. No pain medication being taken, just Tylenol. Had two week of home health and her son helped her with her home health HEP. Wants to return to playing with grandchildren.     Limitations  Lifting;Standing;Walking;House hold activities;Other (comment)    How long can you sit comfortably?  n/a    How long can you stand comfortably?  hasn't been standing much    How long can you walk comfortably?  only been able to walk 4 rounds of house    Diagnostic tests  sx    Patient Stated Goals  play with grandchildren, return to normal routine     Currently in Pain?  Yes    Pain Score  3     Pain Location  Knee    Pain Orientation  Right    Pain Descriptors / Indicators  Aching    Pain Type  Surgical pain    Pain Onset  More than a month ago    Pain Frequency  Intermittent       Treatment:   TherEx Ambulate without AD, 160 ft: 2 trials one prior to manual and one  trial after manual. Improved gait mechanics with decreased knee flexion and improved stride length. No episodes of instability   #3 ankle weights: Seated LAQ 10x  March 10x (seated)   Quantum leg press: RLE only #30 10x ; 2 sets ; tactile and verbal cues for alignment and velocity of movement for maximal muscle recruitment strategies.    Manual: Patellofemoral mobilizations: inferior, superior, medial, lateral. 30 seconds each direction 4x                Hamstring stretch 2x60 seconds RLE             PROM flexion and extension 6x each direction, hold 10-20 seconds             Prone: R hip flexor/quad stretch 4x 30 seconds  Roller to R calf: 2 minutes             Medial quadriceps and adductor STM 2 minutes; noted muscle tissue length limitations/ trigger points  Ice cup massage 3 minutes                          PT  Education - 03/06/18 1352    Education Details  exercise technique, manual , ambulating without cane    Person(s) Educated  Patient    Methods  Explanation;Tactile cues;Demonstration;Verbal cues    Comprehension  Verbalized understanding;Returned demonstration;Tactile cues required;Need further instruction;Verbal cues required       PT Short Term Goals - 02/24/18 1610      PT SHORT TERM GOAL #1   Title  Patient will be independent in home exercise program to improve strength/mobility for better functional independence with ADLs    Baseline  compliance with HH HEP 12/16: compliant    Time  2    Period  Weeks    Status  Achieved      PT SHORT TERM GOAL #2   Title  Patient will perform sit to stand without UE suport to demonstrate improved LE strength and stability     Baseline  11/14: require BUE support 12/16: requires BUE support     Time  2    Period  Weeks    Status  On-going    Target Date  03/10/18      PT SHORT TERM GOAL #3   Title  Patient will increase R knee AROM to 105 degrees flexion and -5 degrees extension for improved mobility and gait mechanics    Baseline  11/14: AROM: 95 flexion extension -10. 12/16: flexion 120, extension -3     Time  2    Period  Weeks    Status  Achieved      PT SHORT TERM GOAL #4   Title  Patient will increase R knee strength to 3/5 as to improve functional strength for independent gait, increased standing tolerance and increased ADL ability.    Baseline  11/14: 2+/5 12/16: 4-/5     Time  2    Period  Weeks    Status  Achieved        PT Long Term Goals - 02/24/18 1401      PT LONG TERM GOAL #1   Title  Patient will increase R LE strength to 4+/5 as to improve functional strength for independent gait, increased standing tolerance and increased ADL ability.    Baseline  11/14: R knee 2+/5 12/16: 4-/5     Time  4    Period  Weeks  Status  Partially Met    Target Date  03/24/18      PT LONG TERM GOAL #2   Title  Patient will  report a worst pain of 3/10 on VAS in  R knee  to improve tolerance with ADLs and reduced symptoms with activities.     Baseline  11/14: 7/10 worst pain  12/16: 5/10     Time  4    Period  Weeks    Status  Partially Met    Target Date  03/24/18      PT LONG TERM GOAL #3   Title  Patient will increase R knee AROM to 120 degrees flexion and neutral extension for return to PLOF and natural ambulatory mechanics.     Baseline  11/14: R knee 95 flexion -10 extension 12/16: 120 flexion, -3 extension after stretch     Time  4    Period  Weeks    Status  Partially Met    Target Date  03/24/18      PT LONG TERM GOAL #4   Title  Patient (> 52 years old) will complete five times sit to stand test in < 15 seconds indicating an increased LE strength and improved balance.    Baseline  11/14: 22 seconds with excessive UE support and R knee forward 12/16: 14 seconds with BUE support     Time  4    Period  Weeks    Status  Partially Met    Target Date  03/24/18      PT LONG TERM GOAL #5   Title  Patient will increase lower extremity functional scale to >60/80 to demonstrate improved functional mobility and increased tolerance with ADLs.     Baseline  11/14: 40/80 12/16 : 35/80     Time  4    Period  Weeks    Status  On-going    Target Date  03/24/18      Additional Long Term Goals   Additional Long Term Goals  Yes      PT LONG TERM GOAL #6   Title  Patient will increase 10 meter walk test to >1.41ms with least restrictive device to improve gait speed for better community ambulation and to reduce fall risk.    Baseline  11/14: .71 m/s with cane 12/16: .83 m/s without cane    Time  4    Period  Weeks    Status  Partially Met    Target Date  03/24/18      PT LONG TERM GOAL #7   Title  Patient will increase six minute walk test distance to >1000 for progression to community ambulator and improve gait ability    Baseline  12/16: 798 ft with and without cane    Time  4    Period  Weeks     Status  New    Target Date  03/24/18            Plan - 03/06/18 1440    Clinical Impression Statement  Patient presents with increased stiffness/ decreased muscle tissue length of RLE due to illness and limited HEP compliance. Patient demonstrated improved lengthening of musculature and biomechanics of ambulation after manual and there ex. Patient will benefit from skilled physical therapy to reduce pain, improve ROM and strength, and increase mobility for return to PLOF.    Rehab Potential  Good    Clinical Impairments Affecting Rehab Potential  (+) family support, prior level of function, high  pain tolerance (-) age    PT Frequency  2x / week    PT Duration  4 weeks    PT Treatment/Interventions  ADLs/Self Care Home Management;Aquatic Therapy;Cryotherapy;Electrical Stimulation;Iontophoresis 68m/ml Dexamethasone;Moist Heat;Traction;Ultrasound;DME Instruction;Balance training;Therapeutic exercise;Therapeutic activities;Functional mobility training;Stair training;Gait training;Neuromuscular re-education;Patient/family education;Scar mobilization;Compression bandaging;Manual lymph drainage;Manual techniques;Passive range of motion;Dry needling;Energy conservation;Splinting;Taping    PT Next Visit Plan  ROM, strength     PT Home Exercise Plan  continue HEP     Consulted and Agree with Plan of Care  Patient;Family member/caregiver    Family Member Consulted  son       Patient will benefit from skilled therapeutic intervention in order to improve the following deficits and impairments:  Abnormal gait, Decreased activity tolerance, Decreased balance, Decreased endurance, Decreased coordination, Decreased mobility, Decreased range of motion, Decreased scar mobility, Decreased strength, Difficulty walking, Increased edema, Hypomobility, Impaired perceived functional ability, Impaired flexibility, Improper body mechanics, Postural dysfunction, Pain  Visit Diagnosis: Stiffness of right knee, not  elsewhere classified  Acute pain of right knee  Other abnormalities of gait and mobility  Muscle weakness (generalized)     Problem List There are no active problems to display for this patient.  MJanna Arch PT, DPT   03/06/2018, 2:41 PM  CLong ViewMAIN RDo[facility] Surgery IncSERVICES [address]RLambert [place] [address]Phone: [phone]  Fax:  [phone] Name: Sarah LOUGHMILLERMRN: [id number] of Birth: [birth year]

## 2018-03-10 ENCOUNTER — Ambulatory Visit: Payer: Medicare Other

## 2018-03-10 DIAGNOSIS — R2689 Other abnormalities of gait and mobility: Secondary | ICD-10-CM

## 2018-03-10 DIAGNOSIS — M6281 Muscle weakness (generalized): Secondary | ICD-10-CM

## 2018-03-10 DIAGNOSIS — M25661 Stiffness of right knee, not elsewhere classified: Secondary | ICD-10-CM

## 2018-03-10 DIAGNOSIS — M25561 Pain in right knee: Secondary | ICD-10-CM

## 2018-03-10 NOTE — Therapy (Signed)
[place] MAIN [facility] SERVICES [address] [place], [place], [address] Phone: [phone]   Fax:  [phone]  Physical Therapy Treatment  Patient Details  Name: Sarah Friedman MRN: [id number] Date of Birth: 12-14-1933 Referring Provider (PT): Priscille Kluver , [place]    Encounter Date: 03/10/2018  PT End of Session - 03/10/18 1353    Visit Number  10    Number of Visits  16    Date for PT Re-Evaluation  03/24/18    Authorization Type   2/10 start 12/16    PT Start Time  1346    PT Stop Time  1430    PT Time Calculation (min)  44 min    Equipment Utilized During Treatment  Gait belt    Activity Tolerance  Patient tolerated treatment well    Behavior During Therapy  [facility] for tasks assessed/performed       Past Medical History:  Diagnosis Date  . Arthritis   . Hypertension   . Vertigo     Past Surgical History:  Procedure Laterality Date  . CATARACT EXTRACTION W/PHACO Right 04/23/2017   Procedure: CATARACT EXTRACTION PHACO AND INTRAOCULAR LENS PLACEMENT (IOC);  Surgeon: Birder Robson, MD;  Location: [facility] ORS;  Service: Ophthalmology;  Laterality: Right;  [place] 00:43.8 AP% 18.3 CDE 8.05 Fluid Pack lot # [id number] H  . CATARACT EXTRACTION W/PHACO Left 05/14/2017   Procedure: CATARACT EXTRACTION PHACO AND INTRAOCULAR LENS PLACEMENT (IOC);  Surgeon: Birder Robson, MD;  Location: [facility] ORS;  Service: Ophthalmology;  Laterality: Left;  [place] 00:29.5 AP% 17.2 CDE 5.07 Fluid Pack Lot # [id number] H  . JOINT REPLACEMENT Left    TKR  . OVARIAN CYST SURGERY    . TONSILLECTOMY      There were no vitals filed for this visit.  Subjective Assessment - 03/10/18 1350    Subjective  Patient reports she walked around the gravel lot and sons yard.  Woke up more sore this morning.  Has been having difficulty eating, however was finally able to eat something this weekend other than a sandwhich.      Pertinent History  Patient is a pleasant 84 year old  female who received a R TKA on 01/09/18. PMH includes HTN, HLD, anemia, osteopenia, right ventricular end diastolic pressure elevation. Patient presents with son, using cane for the first time. Was not walking with an AD prior to surgery. Has a history of L TKA. No pain medication being taken, just Tylenol. Had two week of home health and her son helped her with her home health HEP. Wants to return to playing with grandchildren.     Limitations  Lifting;Standing;Walking;House hold activities;Other (comment)    How long can you sit comfortably?  n/a    How long can you stand comfortably?  hasn't been standing much    How long can you walk comfortably?  only been able to walk 4 rounds of house    Diagnostic tests  sx    Patient Stated Goals  play with grandchildren, return to normal routine     Currently in Pain?  Yes    Pain Score  3     Pain Location  Knee    Pain Orientation  Right    Pain Descriptors / Indicators  Aching    Pain Type  Surgical pain    Pain Onset  More than a month ago    Pain Frequency  Intermittent       TherEx Ambulate without AD, 160  ft: 2 trials one prior to manual and one trial after manual. Improved gait mechanics with decreased knee flexion and improved stride length. No episodes of instability  Seated hamstring stretch 2x 30 seconds on 6" step  6" step ups 10x each LE.   Airex pad: balloon taps SUE support reaching inside and outside BOS to promote weight shift onto RLE     Quantum leg press: RLE only #30 10x ; 2 sets ; tactile and verbal cues for alignment and velocity of movement for maximal muscle recruitment strategies.    Manual: Patellofemoral mobilizations: inferior, superior, medial, lateral. 30 seconds each direction 4x                 Hamstring stretch 2x60 seconds RLE             PROM flexion and extension 6x each direction, hold 10-20 seconds             Prone: R hip flexor/quad stretch 4x 30 seconds             Medial quadriceps and adductor  STM 2 minutes; noted muscle tissue length limitations/ trigger points             Ice cup massage 3 minutes                      PT Education - 03/10/18 1353    Education Details  exercise technique, stability, manual, ambulating without cane.     Person(s) Educated  Patient    Methods  Explanation;Demonstration;Tactile cues;Verbal cues    Comprehension  Verbalized understanding;Returned demonstration;Verbal cues required;Tactile cues required       PT Short Term Goals - 02/24/18 1610      PT SHORT TERM GOAL #1   Title  Patient will be independent in home exercise program to improve strength/mobility for better functional independence with ADLs    Baseline  compliance with HH HEP 12/16: compliant    Time  2    Period  Weeks    Status  Achieved      PT SHORT TERM GOAL #2   Title  Patient will perform sit to stand without UE suport to demonstrate improved LE strength and stability     Baseline  11/14: require BUE support 12/16: requires BUE support     Time  2    Period  Weeks    Status  On-going    Target Date  03/10/18      PT SHORT TERM GOAL #3   Title  Patient will increase R knee AROM to 105 degrees flexion and -5 degrees extension for improved mobility and gait mechanics    Baseline  11/14: AROM: 95 flexion extension -10. 12/16: flexion 120, extension -3     Time  2    Period  Weeks    Status  Achieved      PT SHORT TERM GOAL #4   Title  Patient will increase R knee strength to 3/5 as to improve functional strength for independent gait, increased standing tolerance and increased ADL ability.    Baseline  11/14: 2+/5 12/16: 4-/5     Time  2    Period  Weeks    Status  Achieved        PT Long Term Goals - 02/24/18 1401      PT LONG TERM GOAL #1   Title  Patient will increase R LE strength to 4+/5 as to improve functional strength  for independent gait, increased standing tolerance and increased ADL ability.    Baseline  11/14: R knee 2+/5 12/16:  4-/5     Time  4    Period  Weeks    Status  Partially Met    Target Date  03/24/18      PT LONG TERM GOAL #2   Title  Patient will report a worst pain of 3/10 on VAS in  R knee  to improve tolerance with ADLs and reduced symptoms with activities.     Baseline  11/14: 7/10 worst pain  12/16: 5/10     Time  4    Period  Weeks    Status  Partially Met    Target Date  03/24/18      PT LONG TERM GOAL #3   Title  Patient will increase R knee AROM to 120 degrees flexion and neutral extension for return to PLOF and natural ambulatory mechanics.     Baseline  11/14: R knee 95 flexion -10 extension 12/16: 120 flexion, -3 extension after stretch     Time  4    Period  Weeks    Status  Partially Met    Target Date  03/24/18      PT LONG TERM GOAL #4   Title  Patient (> 11 years old) will complete five times sit to stand test in < 15 seconds indicating an increased LE strength and improved balance.    Baseline  11/14: 22 seconds with excessive UE support and R knee forward 12/16: 14 seconds with BUE support     Time  4    Period  Weeks    Status  Partially Met    Target Date  03/24/18      PT LONG TERM GOAL #5   Title  Patient will increase lower extremity functional scale to >60/80 to demonstrate improved functional mobility and increased tolerance with ADLs.     Baseline  11/14: 40/80 12/16 : 35/80     Time  4    Period  Weeks    Status  On-going    Target Date  03/24/18      Additional Long Term Goals   Additional Long Term Goals  Yes      PT LONG TERM GOAL #6   Title  Patient will increase 10 meter walk test to >1.79ms with least restrictive device to improve gait speed for better community ambulation and to reduce fall risk.    Baseline  11/14: .71 m/s with cane 12/16: .83 m/s without cane    Time  4    Period  Weeks    Status  Partially Met    Target Date  03/24/18      PT LONG TERM GOAL #7   Title  Patient will increase six minute walk test distance to >1000 for  progression to community ambulator and improve gait ability    Baseline  12/16: 798 ft with and without cane    Time  4    Period  Weeks    Status  New    Target Date  03/24/18            Plan - 03/10/18 1443    Clinical Impression Statement  Patient has limited weight shift over RLE and tolerates very small doses of weight acceptance.  Patient educated on weaning from cane to promote return to PLOF. Patient demonstrated improved lengthening of musculature and biomechanics of ambulation after manual and there  ex. Patient will benefit from skilled physical therapy to reduce pain, improve ROM and strength, and increase mobility for return to PLOF.    Rehab Potential  Good    Clinical Impairments Affecting Rehab Potential  (+) family support, prior level of function, high pain tolerance (-) age    PT Frequency  2x / week    PT Duration  4 weeks    PT Treatment/Interventions  ADLs/Self Care Home Management;Aquatic Therapy;Cryotherapy;Electrical Stimulation;Iontophoresis 12m/ml Dexamethasone;Moist Heat;Traction;Ultrasound;DME Instruction;Balance training;Therapeutic exercise;Therapeutic activities;Functional mobility training;Stair training;Gait training;Neuromuscular re-education;Patient/family education;Scar mobilization;Compression bandaging;Manual lymph drainage;Manual techniques;Passive range of motion;Dry needling;Energy conservation;Splinting;Taping    PT Next Visit Plan  ROM, strength     PT Home Exercise Plan  continue HEP     Consulted and Agree with Plan of Care  Patient;Family member/caregiver    Family Member Consulted  son       Patient will benefit from skilled therapeutic intervention in order to improve the following deficits and impairments:  Abnormal gait, Decreased activity tolerance, Decreased balance, Decreased endurance, Decreased coordination, Decreased mobility, Decreased range of motion, Decreased scar mobility, Decreased strength, Difficulty walking, Increased edema,  Hypomobility, Impaired perceived functional ability, Impaired flexibility, Improper body mechanics, Postural dysfunction, Pain  Visit Diagnosis: Stiffness of right knee, not elsewhere classified  Acute pain of right knee  Other abnormalities of gait and mobility  Muscle weakness (generalized)     Problem List There are no active problems to display for this patient.  MJanna Arch PT, DPT   03/10/2018, 2:44 PM  CL[place]MAIN [facility]SERVICES [address] [address]Phone: [phone]  Fax:  [phone] Name: Sarah WESTMORELANDMRN: [id number] of Birth: 106/08/1933

## 2018-03-17 ENCOUNTER — Ambulatory Visit: Payer: Medicare Other | Attending: Infectious Diseases | Admitting: Physical Therapy

## 2018-03-17 DIAGNOSIS — M6281 Muscle weakness (generalized): Secondary | ICD-10-CM | POA: Insufficient documentation

## 2018-03-17 DIAGNOSIS — R2689 Other abnormalities of gait and mobility: Secondary | ICD-10-CM | POA: Insufficient documentation

## 2018-03-17 DIAGNOSIS — M25661 Stiffness of right knee, not elsewhere classified: Secondary | ICD-10-CM | POA: Diagnosis present

## 2018-03-17 DIAGNOSIS — M25561 Pain in right knee: Secondary | ICD-10-CM | POA: Insufficient documentation

## 2018-03-17 NOTE — Therapy (Signed)
[place] MAIN [facility] SERVICES [address] [place], [place], [address] Phone: [phone]   Fax:  [phone]  Physical Therapy Treatment  Patient Details  Name: Sarah Friedman MRN: [id number] Date of Birth: [birth year] Referring Provider (PT): Priscille Kluver , [place]    Encounter Date: 03/17/2018  PT End of Session - 03/17/18 1357    Visit Number  11    Number of Visits  16    Date for PT Re-Evaluation  03/24/18    Authorization Type   3/10 start 12/16    PT Start Time  1350    PT Stop Time  1430    PT Time Calculation (min)  40 min    Equipment Utilized During Treatment  Gait belt    Activity Tolerance  Patient tolerated treatment well    Behavior During Therapy  [facility] for tasks assessed/performed       Past Medical History:  Diagnosis Date  . Arthritis   . Hypertension   . Vertigo     Past Surgical History:  Procedure Laterality Date  . CATARACT EXTRACTION W/PHACO Right 04/23/2017   Procedure: CATARACT EXTRACTION PHACO AND INTRAOCULAR LENS PLACEMENT (IOC);  Surgeon: Birder Robson, MD;  Location: [facility] ORS;  Service: Ophthalmology;  Laterality: Right;  [place] 00:43.8 AP% 18.3 CDE 8.05 Fluid Pack lot # [id number]  . CATARACT EXTRACTION W/PHACO Left 05/14/2017   Procedure: CATARACT EXTRACTION PHACO AND INTRAOCULAR LENS PLACEMENT (IOC);  Surgeon: Birder Robson, MD;  Location: [facility] ORS;  Service: Ophthalmology;  Laterality: Left;  [place] 00:29.5 AP% 17.2 CDE 5.07 Fluid Pack Lot # [id number]  . JOINT REPLACEMENT Left    TKR  . OVARIAN CYST SURGERY    . TONSILLECTOMY      There were no vitals filed for this visit.  Subjective Assessment - 03/17/18 1354    Subjective  Patient states that she is doing well, but feels a little bit sore. She attributes this to tossing and turning the other night when she couldn't sleep. She states that she is better able to eat since her last visit.     Pertinent History  Patient is a pleasant 85 year old  female who received a R TKA on 01/09/18. PMH includes HTN, HLD, anemia, osteopenia, right ventricular end diastolic pressure elevation. Patient presents with son, using cane for the first time. Was not walking with an AD prior to surgery. Has a history of L TKA. No pain medication being taken, just Tylenol. Had two week of home health and her son helped her with her home health HEP. Wants to return to playing with grandchildren.     Limitations  Lifting;Standing;Walking;House hold activities;Other (comment)    How long can you sit comfortably?  n/a    How long can you stand comfortably?  hasn't been standing much    How long can you walk comfortably?  only been able to walk 4 rounds of house    Diagnostic tests  sx    Patient Stated Goals  play with grandchildren, return to normal routine     Currently in Pain?  Yes    Pain Score  3     Pain Location  Knee    Pain Orientation  Right    Pain Descriptors / Indicators  Burning    Pain Type  Surgical pain    Pain Onset  More than a month ago       TherEx NuStep, Seat 7, L3, x5 min to encourage controlled  knee flexion/extension and and even weight bearing through BLE. (during hx 3 min unbilled)  Ambulate without AD, x400 feet No episodes of instability. Patient able to maintain symmetrical stride length with good weight shift side to side.   Seated hamstring stretch 4x 30 seconds on 6" step  Quantum leg press:  BLE #50 x15  RLE only #35 10x ; 2 sets ; tactile and verbal cues for alignment and velocity of movement for maximal muscle recruitment strategies.  Manual:  Patellofemoral mobilizations: inferior, superior, medial, lateral.30 seconds each direction 4x Hamstring stretch 2x60 seconds RLE  Hip aBduction stretch 2x30 seconds RLE PROM flexion and extension6x each direction, hold 10-20 seconds Prone: R hip flexor/quadstretch 4x 30 seconds Medial quadriceps and adductor STM 2  minutes  PT Education - 03/17/18 1356    Education Details  exercise technique, gait mechanics    Person(s) Educated  Patient    Methods  Explanation;Demonstration;Tactile cues;Verbal cues    Comprehension  Verbalized understanding;Returned demonstration;Need further instruction;Verbal cues required;Tactile cues required       PT Short Term Goals - 02/24/18 1610      PT SHORT TERM GOAL #1   Title  Patient will be independent in home exercise program to improve strength/mobility for better functional independence with ADLs    Baseline  compliance with HH HEP 12/16: compliant    Time  2    Period  Weeks    Status  Achieved      PT SHORT TERM GOAL #2   Title  Patient will perform sit to stand without UE suport to demonstrate improved LE strength and stability     Baseline  11/14: require BUE support 12/16: requires BUE support     Time  2    Period  Weeks    Status  On-going    Target Date  03/10/18      PT SHORT TERM GOAL #3   Title  Patient will increase R knee AROM to 105 degrees flexion and -5 degrees extension for improved mobility and gait mechanics    Baseline  11/14: AROM: 95 flexion extension -10. 12/16: flexion 120, extension -3     Time  2    Period  Weeks    Status  Achieved      PT SHORT TERM GOAL #4   Title  Patient will increase R knee strength to 3/5 as to improve functional strength for independent gait, increased standing tolerance and increased ADL ability.    Baseline  11/14: 2+/5 12/16: 4-/5     Time  2    Period  Weeks    Status  Achieved        PT Long Term Goals - 02/24/18 1401      PT LONG TERM GOAL #1   Title  Patient will increase R LE strength to 4+/5 as to improve functional strength for independent gait, increased standing tolerance and increased ADL ability.    Baseline  11/14: R knee 2+/5 12/16: 4-/5     Time  4    Period  Weeks    Status  Partially Met    Target Date  03/24/18      PT LONG TERM GOAL #2   Title  Patient will report a  worst pain of 3/10 on VAS in  R knee  to improve tolerance with ADLs and reduced symptoms with activities.     Baseline  11/14: 7/10 worst pain  12/16: 5/10     Time  4    Period  Weeks    Status  Partially Met    Target Date  03/24/18      PT LONG TERM GOAL #3   Title  Patient will increase R knee AROM to 120 degrees flexion and neutral extension for return to PLOF and natural ambulatory mechanics.     Baseline  11/14: R knee 95 flexion -10 extension 12/16: 120 flexion, -3 extension after stretch     Time  4    Period  Weeks    Status  Partially Met    Target Date  03/24/18      PT LONG TERM GOAL #4   Title  Patient (> 63 years old) will complete five times sit to stand test in < 15 seconds indicating an increased LE strength and improved balance.    Baseline  11/14: 22 seconds with excessive UE support and R knee forward 12/16: 14 seconds with BUE support     Time  4    Period  Weeks    Status  Partially Met    Target Date  03/24/18      PT LONG TERM GOAL #5   Title  Patient will increase lower extremity functional scale to >60/80 to demonstrate improved functional mobility and increased tolerance with ADLs.     Baseline  11/14: 40/80 12/16 : 35/80     Time  4    Period  Weeks    Status  On-going    Target Date  03/24/18      Additional Long Term Goals   Additional Long Term Goals  Yes      PT LONG TERM GOAL #6   Title  Patient will increase 10 meter walk test to >1.57ms with least restrictive device to improve gait speed for better community ambulation and to reduce fall risk.    Baseline  11/14: .71 m/s with cane 12/16: .83 m/s without cane    Time  4    Period  Weeks    Status  Partially Met    Target Date  03/24/18      PT LONG TERM GOAL #7   Title  Patient will increase six minute walk test distance to >1000 for progression to community ambulator and improve gait ability    Baseline  12/16: 798 ft with and without cane    Time  4    Period  Weeks    Status  New     Target Date  03/24/18            Plan - 03/17/18 1519    Clinical Impression Statement  Patient presents to clinic with excellent motivation and is amenable to therapy. Patient demonstrates more normalized gait pattern with symmetrical strides and weight shift when walking without AD. Patient able to achieve R knee ROM WFL actively, but continues to demonstrate some decreased motor control during strengthening activities (both concentric and eccentric phases). Patient will continue to benefit from skilled therapeutic intervention to address deficits in strength, mobility, balance and activity tolerance in order to return to her PLOF.    Rehab Potential  Good    Clinical Impairments Affecting Rehab Potential  (+) family support, prior level of function, high pain tolerance (-) age    PT Frequency  2x / week    PT Duration  4 weeks    PT Treatment/Interventions  ADLs/Self Care Home Management;Aquatic Therapy;Cryotherapy;Electrical Stimulation;Iontophoresis '4mg'$ /ml Dexamethasone;Moist Heat;Traction;Ultrasound;DME Instruction;Balance training;Therapeutic exercise;Therapeutic activities;Functional mobility training;Stair training;Gait training;Neuromuscular re-education;Patient/family  education;Scar mobilization;Compression bandaging;Manual lymph drainage;Manual techniques;Passive range of motion;Dry needling;Energy conservation;Splinting;Taping    PT Next Visit Plan  ROM, strength     PT Home Exercise Plan  continue HEP     Consulted and Agree with Plan of Care  Patient;Family member/caregiver    Family Member Consulted  son       Patient will benefit from skilled therapeutic intervention in order to improve the following deficits and impairments:  Abnormal gait, Decreased activity tolerance, Decreased balance, Decreased endurance, Decreased coordination, Decreased mobility, Decreased range of motion, Decreased scar mobility, Decreased strength, Difficulty walking, Increased edema, Hypomobility,  Impaired perceived functional ability, Impaired flexibility, Improper body mechanics, Postural dysfunction, Pain  Visit Diagnosis: Acute pain of right knee  Stiffness of right knee, not elsewhere classified  Other abnormalities of gait and mobility  Muscle weakness (generalized)     Problem List There are no active problems to display for this patient.  Myles Gip PT, DPT [phone] 03/17/2018, 3:33 PM  [place] MAIN [facility] SERVICES [address] [place], [place], [address] Phone: [phone]   Fax:  [phone]  Name: Sarah Friedman MRN: [id number] Date of Birth: 09/01/1933

## 2018-03-25 ENCOUNTER — Ambulatory Visit: Payer: Medicare Other

## 2018-03-25 DIAGNOSIS — M6281 Muscle weakness (generalized): Secondary | ICD-10-CM

## 2018-03-25 DIAGNOSIS — M25561 Pain in right knee: Secondary | ICD-10-CM | POA: Diagnosis not present

## 2018-03-25 DIAGNOSIS — M25661 Stiffness of right knee, not elsewhere classified: Secondary | ICD-10-CM

## 2018-03-25 DIAGNOSIS — R2689 Other abnormalities of gait and mobility: Secondary | ICD-10-CM

## 2018-03-25 NOTE — Therapy (Signed)
[place] MAIN [facility] SERVICES [address] [place], [place], [address] Phone: [phone]   Fax:  [phone]  Physical Therapy Treatment/ [facility]ERT  Patient Details  Name: Sarah Friedman MRN: [id number] Date of Birth: 1933-04-20 Referring Provider (PT): Priscille Kluver , [place]    Encounter Date: 03/25/2018  PT End of Session - 03/25/18 1559    Visit Number  12    Number of Visits  20    Date for PT Re-Evaluation  04/22/18    Authorization Type   4/10 start 12/16    PT Start Time  1503    PT Stop Time  1551    PT Time Calculation (min)  48 min    Equipment Utilized During Treatment  Gait belt    Activity Tolerance  Patient tolerated treatment well    Behavior During Therapy  [facility] for tasks assessed/performed       Past Medical History:  Diagnosis Date  . Arthritis   . Hypertension   . Vertigo     Past Surgical History:  Procedure Laterality Date  . CATARACT EXTRACTION W/PHACO Right 04/23/2017   Procedure: CATARACT EXTRACTION PHACO AND INTRAOCULAR LENS PLACEMENT (IOC);  Surgeon: Birder Robson, MD;  Location: [facility] ORS;  Service: Ophthalmology;  Laterality: Right;  [place] 00:43.8 AP% 18.3 CDE 8.05 Fluid Pack lot # [id number] H  . CATARACT EXTRACTION W/PHACO Left 05/14/2017   Procedure: CATARACT EXTRACTION PHACO AND INTRAOCULAR LENS PLACEMENT (IOC);  Surgeon: Birder Robson, MD;  Location: [facility] ORS;  Service: Ophthalmology;  Laterality: Left;  [place] 00:29.5 AP% 17.2 CDE 5.07 Fluid Pack Lot # [id number]  . JOINT REPLACEMENT Left    TKR  . OVARIAN CYST SURGERY    . TONSILLECTOMY      There were no vitals filed for this visit.  Current : 5/10 pain Subjective Assessment - 03/25/18 1557    Subjective  Patient has been out of town, she went to [place] to see her Psychologist, sport and exercise. Reports surgeon is pleased with her progress and does not have to return for another 6 months. The drive to [facility] and back increased her pain in both knees due to not  stopping.     Pertinent History  Patient is a pleasant 85 year old female who received a R TKA on 01/09/18. PMH includes HTN, HLD, anemia, osteopenia, right ventricular end diastolic pressure elevation. Patient presents with son, using cane for the first time. Was not walking with an AD prior to surgery. Has a history of L TKA. No pain medication being taken, just Tylenol. Had two week of home health and her son helped her with her home health HEP. Wants to return to playing with grandchildren.     Limitations  Lifting;Standing;Walking;House hold activities;Other (comment)    How long can you sit comfortably?  n/a    How long can you stand comfortably?  hasn't been standing much    How long can you walk comfortably?  only been able to walk 4 rounds of house    Diagnostic tests  sx    Patient Stated Goals  play with grandchildren, return to normal routine     Currently in Pain?  Yes    Pain Score  5     Pain Location  Knee    Pain Orientation  Right;Left    Pain Descriptors / Indicators  Aching;Burning    Pain Type  Surgical pain    Pain Onset  More than a month ago    Pain  Frequency  Intermittent      STS: 13 seconds with no UE support  Strength: hip 4/5 knee 4-/5  VAS worst: 5/10 pain down into the scar  AROM: 0 extension 122 flexion LEFS: 47/80  10MWT: 11 seconds each LE  6 MWT : 812 ft without cane   Patient requires CGA with all standing and transfer activities for stability. Verbal cueing for task orientation and sequencing of task required throughout the session.     Prone:  Hip flexor/quadriceps stretch 3x 30 second holds to decrease knee pain  Supine:  Medial quadriceps and adductor STM 5 minutes; noted muscle tissue length limitations/ trigger points  Patient's condition has the potential to improve in response to therapy. Maximum improvement is yet to be obtained. The anticipated improvement is attainable and reasonable in a generally predictable time.  Patient reports  she continues to get more movement and is able to do more around the house. Is having more pain than normal since her car trip due to sitting so long.                    PT Education - 03/25/18 1558    Education Details  goals, POC, exercise technique, re-inforce driving only 1.5 hours and getting out to stretch     Person(s) Educated  Patient    Methods  Explanation;Demonstration;Tactile cues;Verbal cues    Comprehension  Verbalized understanding;Need further instruction;Returned demonstration;Tactile cues required;Verbal cues required       PT Short Term Goals - 03/25/18 1531      PT SHORT TERM GOAL #1   Title  Patient will be independent in home exercise program to improve strength/mobility for better functional independence with ADLs    Baseline  compliance with HH HEP 12/16: compliant    Time  2    Period  Weeks    Status  Achieved      PT SHORT TERM GOAL #2   Title  Patient will perform sit to stand without UE suport to demonstrate improved LE strength and stability     Baseline  11/14: require BUE support 12/16: requires BUE support 1/14: BUE support     Time  2    Period  Weeks    Status  On-going    Target Date  04/08/18      PT SHORT TERM GOAL #3   Title  Patient will increase R knee AROM to 105 degrees flexion and -5 degrees extension for improved mobility and gait mechanics    Baseline  11/14: AROM: 95 flexion extension -10. 12/16: flexion 120, extension -3     Time  2    Period  Weeks    Status  Achieved      PT SHORT TERM GOAL #4   Title  Patient will increase R knee strength to 3/5 as to improve functional strength for independent gait, increased standing tolerance and increased ADL ability.    Baseline  11/14: 2+/5 12/16: 4-/5     Time  2    Period  Weeks    Status  Achieved        PT Long Term Goals - 03/25/18 1526      PT LONG TERM GOAL #1   Title  Patient will increase R LE strength to 4+/5 as to improve functional strength for  independent gait, increased standing tolerance and increased ADL ability.    Baseline  11/14: R knee 2+/5 12/16: 4-/5  1/14: R hip 4/5 Knee  4-/5     Time  4    Period  Weeks    Status  Partially Met    Target Date  04/22/18      PT LONG TERM GOAL #2   Title  Patient will report a worst pain of 3/10 on VAS in  R knee  to improve tolerance with ADLs and reduced symptoms with activities.     Baseline  11/14: 7/10 worst pain  12/16: 5/10 1/14: 5/10     Time  4    Period  Weeks    Status  Partially Met    Target Date  04/22/18      PT LONG TERM GOAL #3   Title  Patient will increase R knee AROM to 120 degrees flexion and neutral extension for return to PLOF and natural ambulatory mechanics.     Baseline  11/14: R knee 95 flexion -10 extension 12/16: 120 flexion, -3 extension after stretch 1/14: >120 flexion, neutral extensions     Time  4    Period  Weeks    Status  Achieved      PT LONG TERM GOAL #4   Title  Patient (> 88 years old) will complete five times sit to stand test in < 15 seconds indicating an increased LE strength and improved balance.    Baseline  11/14: 22 seconds with excessive UE support and R knee forward 12/16: 14 seconds with BUE support 1/14: 13 seconds seconds BUE suppor     Time  4    Period  Weeks    Status  Partially Met    Target Date  04/22/18      PT LONG TERM GOAL #5   Title  Patient will increase lower extremity functional scale to >60/80 to demonstrate improved functional mobility and increased tolerance with ADLs.     Baseline  11/14: 40/80 12/16 : 35/80 1/14: 47/80     Time  4    Period  Weeks    Status  Partially Met    Target Date  04/22/18      PT LONG TERM GOAL #6   Title  Patient will increase 10 meter walk test to >1.22ms with least restrictive device to improve gait speed for better community ambulation and to reduce fall risk.    Baseline  11/14: .71 m/s with cane 12/16: .83 m/s without cane 1/14: .91 m/s without cane    Time  4    Period   Weeks    Status  Partially Met    Target Date  04/22/18      PT LONG TERM GOAL #7   Title  Patient will increase six minute walk test distance to >1000 for progression to community ambulator and improve gait ability    Baseline  12/16: 798 ft with and without cane 1/14: 812 ft without cane    Time  4    Period  Weeks    Status  Partially Met    Target Date  04/22/18            Plan - 03/25/18 1608    Clinical Impression Statement  Patient presents with good progress towards goals despite recent increase in pain from prolonged car trip. Patient is now able to perform full ROM without limitation actively as well as ambulate without assistive devices. She continues to rely upon UE's for transition to sit to stand primarily due to fear of LOB at this time. She demonstrates limited motor control  during strengthening activities (both concentric and eccentric phases).  Patient's condition has the potential to improve in response to therapy. Maximum improvement is yet to be obtained. The anticipated improvement is attainable and reasonable in a generally predictable time. Patient will continue to benefit from skilled therapeutic intervention to address deficits in strength, mobility, balance and activity tolerance in order to return to her PLOF.    Rehab Potential  Good    Clinical Impairments Affecting Rehab Potential  (+) family support, prior level of function, high pain tolerance (-) age    PT Frequency  2x / week    PT Duration  4 weeks    PT Treatment/Interventions  ADLs/Self Care Home Management;Aquatic Therapy;Cryotherapy;Electrical Stimulation;Iontophoresis 37m/ml Dexamethasone;Moist Heat;Traction;Ultrasound;DME Instruction;Balance training;Therapeutic exercise;Therapeutic activities;Functional mobility training;Stair training;Gait training;Neuromuscular re-education;Patient/family education;Scar mobilization;Compression bandaging;Manual lymph drainage;Manual techniques;Passive range of  motion;Dry needling;Energy conservation;Splinting;Taping    PT Next Visit Plan  eccentric control     PT Home Exercise Plan  continue HEP     Consulted and Agree with Plan of Care  Patient;Family member/caregiver    Family Member Consulted  son       Patient will benefit from skilled therapeutic intervention in order to improve the following deficits and impairments:  Abnormal gait, Decreased activity tolerance, Decreased balance, Decreased endurance, Decreased coordination, Decreased mobility, Decreased range of motion, Decreased scar mobility, Decreased strength, Difficulty walking, Increased edema, Hypomobility, Impaired perceived functional ability, Impaired flexibility, Improper body mechanics, Postural dysfunction, Pain  Visit Diagnosis: Acute pain of right knee  Stiffness of right knee, not elsewhere classified  Other abnormalities of gait and mobility  Muscle weakness (generalized)     Problem List There are no active problems to display for this patient.  MJanna Arch PT, DPT   03/25/2018, 4:11 PM  [place] [facility] [address] [address]Phone: [phone]  Fax:  [phone] Name: PANEYAH LORTZMRN: [id number] of Birth: 108/14/1935

## 2018-03-27 ENCOUNTER — Ambulatory Visit: Payer: Medicare Other

## 2018-03-27 DIAGNOSIS — M25661 Stiffness of right knee, not elsewhere classified: Secondary | ICD-10-CM

## 2018-03-27 DIAGNOSIS — R2689 Other abnormalities of gait and mobility: Secondary | ICD-10-CM

## 2018-03-27 DIAGNOSIS — M25561 Pain in right knee: Secondary | ICD-10-CM | POA: Diagnosis not present

## 2018-03-27 DIAGNOSIS — M6281 Muscle weakness (generalized): Secondary | ICD-10-CM

## 2018-03-27 NOTE — Therapy (Signed)
[place] MAIN [facility] SERVICES [address] [place], [place], [address] Phone: [phone]   Fax:  [phone]  Physical Therapy Treatment  Patient Details  Name: Sarah Friedman MRN: [id number] Date of Birth: Jul 19, 1933 Referring Provider (PT): Priscille Kluver , [place]    Encounter Date: 03/27/2018  PT End of Session - 03/27/18 0900    Visit Number  13    Number of Visits  20    Date for PT Re-Evaluation  04/22/18    Authorization Type   5/10 start 12/16    PT Start Time  0852    PT Stop Time  0930    PT Time Calculation (min)  38 min    Equipment Utilized During Treatment  Gait belt    Activity Tolerance  Patient tolerated treatment well    Behavior During Therapy  [facility] for tasks assessed/performed       Past Medical History:  Diagnosis Date  . Arthritis   . Hypertension   . Vertigo     Past Surgical History:  Procedure Laterality Date  . CATARACT EXTRACTION W/PHACO Right 04/23/2017   Procedure: CATARACT EXTRACTION PHACO AND INTRAOCULAR LENS PLACEMENT (IOC);  Surgeon: Birder Robson, MD;  Location: [facility] ORS;  Service: Ophthalmology;  Laterality: Right;  [place] 00:43.8 AP% 18.3 CDE 8.05 Fluid Pack lot # [id number] H  . CATARACT EXTRACTION W/PHACO Left 05/14/2017   Procedure: CATARACT EXTRACTION PHACO AND INTRAOCULAR LENS PLACEMENT (IOC);  Surgeon: Birder Robson, MD;  Location: [facility] ORS;  Service: Ophthalmology;  Laterality: Left;  [place] 00:29.5 AP% 17.2 CDE 5.07 Fluid Pack Lot # [id number]  . JOINT REPLACEMENT Left    TKR  . OVARIAN CYST SURGERY    . TONSILLECTOMY      There were no vitals filed for this visit.  Subjective Assessment - 03/27/18 0857    Subjective  Patient reports she is still feeling really stiff throughout her knees of both legs. Has been doing her exercises throughout the day yesterday.     Pertinent History  Patient is a pleasant 85 year old female who received a R TKA on 01/09/18. PMH includes HTN, HLD, anemia,  osteopenia, right ventricular end diastolic pressure elevation. Patient presents with son, using cane for the first time. Was not walking with an AD prior to surgery. Has a history of L TKA. No pain medication being taken, just Tylenol. Had two week of home health and her son helped her with her home health HEP. Wants to return to playing with grandchildren.     Limitations  Lifting;Standing;Walking;House hold activities;Other (comment)    How long can you sit comfortably?  n/a    How long can you stand comfortably?  hasn't been standing much    How long can you walk comfortably?  only been able to walk 4 rounds of house    Diagnostic tests  sx    Patient Stated Goals  play with grandchildren, return to normal routine     Currently in Pain?  Yes    Pain Score  4     Pain Location  Knee    Pain Orientation  Right;Left    Pain Descriptors / Indicators  Aching;Burning    Pain Type  Surgical pain    Pain Onset  More than a month ago    Pain Frequency  Intermittent          Patient is late due to sister in law driving her to her appointment since her husband had  to go help a friend.     NuStep, Seat 7, L4, x4 min to encourage controlled knee flexion/extension and even weight bearing through BLE.    Ambulate without AD, x400 feet No episodes of instability. Patient able to maintain symmetrical stride length with good weight shift side to side.    Airex pad: balloon taps reaching inside and outside BOS without LOB    Quantum leg press:  BLE #60 x15 ; 2 sets ; tactile cueing for body mechanics and velocity cueing for neutral knee alignment  RLE only #45 10x ; 2 sets ; tactile and verbal cues for alignment and velocity of movement for maximal muscle recruitment strategies.    Manual:  Patellofemoral mobilizations: inferior, superior, medial, lateral. 30 seconds each direction 4x               Hamstring stretch 2x60 seconds RLE             Hip aBduction stretch 2x30 seconds RLE              PROM flexion and extension 6x each direction, hold 10-20 second             Medial quadriceps and adductor STM 2 minutes                   PT Education - 03/27/18 0858    Education Details  exercise technique, mobility, HEP     Person(s) Educated  Patient    Methods  Explanation;Demonstration;Tactile cues;Verbal cues    Comprehension  Verbalized understanding;Returned demonstration;Tactile cues required;Need further instruction;Verbal cues required       PT Short Term Goals - 03/25/18 1531      PT SHORT TERM GOAL #1   Title  Patient will be independent in home exercise program to improve strength/mobility for better functional independence with ADLs    Baseline  compliance with HH HEP 12/16: compliant    Time  2    Period  Weeks    Status  Achieved      PT SHORT TERM GOAL #2   Title  Patient will perform sit to stand without UE suport to demonstrate improved LE strength and stability     Baseline  11/14: require BUE support 12/16: requires BUE support 1/14: BUE support     Time  2    Period  Weeks    Status  On-going    Target Date  04/08/18      PT SHORT TERM GOAL #3   Title  Patient will increase R knee AROM to 105 degrees flexion and -5 degrees extension for improved mobility and gait mechanics    Baseline  11/14: AROM: 95 flexion extension -10. 12/16: flexion 120, extension -3     Time  2    Period  Weeks    Status  Achieved      PT SHORT TERM GOAL #4   Title  Patient will increase R knee strength to 3/5 as to improve functional strength for independent gait, increased standing tolerance and increased ADL ability.    Baseline  11/14: 2+/5 12/16: 4-/5     Time  2    Period  Weeks    Status  Achieved        PT Long Term Goals - 03/25/18 1526      PT LONG TERM GOAL #1   Title  Patient will increase R LE strength to 4+/5 as to improve functional strength for independent gait, increased standing  tolerance and increased ADL ability.    Baseline  11/14:  R knee 2+/5 12/16: 4-/5  1/14: R hip 4/5 Knee 4-/5     Time  4    Period  Weeks    Status  Partially Met    Target Date  04/22/18      PT LONG TERM GOAL #2   Title  Patient will report a worst pain of 3/10 on VAS in  R knee  to improve tolerance with ADLs and reduced symptoms with activities.     Baseline  11/14: 7/10 worst pain  12/16: 5/10 1/14: 5/10     Time  4    Period  Weeks    Status  Partially Met    Target Date  04/22/18      PT LONG TERM GOAL #3   Title  Patient will increase R knee AROM to 120 degrees flexion and neutral extension for return to PLOF and natural ambulatory mechanics.     Baseline  11/14: R knee 95 flexion -10 extension 12/16: 120 flexion, -3 extension after stretch 1/14: >120 flexion, neutral extensions     Time  4    Period  Weeks    Status  Achieved      PT LONG TERM GOAL #4   Title  Patient (> 37 years old) will complete five times sit to stand test in < 15 seconds indicating an increased LE strength and improved balance.    Baseline  11/14: 22 seconds with excessive UE support and R knee forward 12/16: 14 seconds with BUE support 1/14: 13 seconds seconds BUE suppor     Time  4    Period  Weeks    Status  Partially Met    Target Date  04/22/18      PT LONG TERM GOAL #5   Title  Patient will increase lower extremity functional scale to >60/80 to demonstrate improved functional mobility and increased tolerance with ADLs.     Baseline  11/14: 40/80 12/16 : 35/80 1/14: 47/80     Time  4    Period  Weeks    Status  Partially Met    Target Date  04/22/18      PT LONG TERM GOAL #6   Title  Patient will increase 10 meter walk test to >1.89ms with least restrictive device to improve gait speed for better community ambulation and to reduce fall risk.    Baseline  11/14: .71 m/s with cane 12/16: .83 m/s without cane 1/14: .91 m/s without cane    Time  4    Period  Weeks    Status  Partially Met    Target Date  04/22/18      PT LONG TERM GOAL #7   Title   Patient will increase six minute walk test distance to >1000 for progression to community ambulator and improve gait ability    Baseline  12/16: 798 ft with and without cane 1/14: 812 ft without cane    Time  4    Period  Weeks    Status  Partially Met    Target Date  04/22/18            Plan - 03/27/18 0916    Clinical Impression Statement  Patient presents with excellent motivation to session today despite feeling very sore in both knees. Decreased reliance upon assistive device when ambulating noted. Improved muscle recruitment with leg press noted with 10 lb increase with bilateral legs.  Patient will continue to benefit from skilled therapeutic intervention to address deficits in strength, mobility, balance and activity tolerance in order to return to her PLOF    Rehab Potential  Good    Clinical Impairments Affecting Rehab Potential  (+) family support, prior level of function, high pain tolerance (-) age    PT Frequency  2x / week    PT Duration  4 weeks    PT Treatment/Interventions  ADLs/Self Care Home Management;Aquatic Therapy;Cryotherapy;Electrical Stimulation;Iontophoresis 35m/ml Dexamethasone;Moist Heat;Traction;Ultrasound;DME Instruction;Balance training;Therapeutic exercise;Therapeutic activities;Functional mobility training;Stair training;Gait training;Neuromuscular re-education;Patient/family education;Scar mobilization;Compression bandaging;Manual lymph drainage;Manual techniques;Passive range of motion;Dry needling;Energy conservation;Splinting;Taping    PT Next Visit Plan  eccentric control     PT Home Exercise Plan  continue HEP     Consulted and Agree with Plan of Care  Patient;Family member/caregiver    Family Member Consulted  son       Patient will benefit from skilled therapeutic intervention in order to improve the following deficits and impairments:  Abnormal gait, Decreased activity tolerance, Decreased balance, Decreased endurance, Decreased coordination,  Decreased mobility, Decreased range of motion, Decreased scar mobility, Decreased strength, Difficulty walking, Increased edema, Hypomobility, Impaired perceived functional ability, Impaired flexibility, Improper body mechanics, Postural dysfunction, Pain  Visit Diagnosis: Acute pain of right knee  Stiffness of right knee, not elsewhere classified  Other abnormalities of gait and mobility  Muscle weakness (generalized)     Problem List There are no active problems to display for this patient.  MJanna Arch PT, DPT   03/27/2018, 9:30 AM  [place] [facility] [address] [address]Phone: [phone]  Fax:  [phone] Name: Sarah DEDRICKMRN: [id number] of Birth: 1February 04, 1935

## 2018-04-01 ENCOUNTER — Ambulatory Visit: Payer: Medicare Other

## 2018-04-01 DIAGNOSIS — M25561 Pain in right knee: Secondary | ICD-10-CM | POA: Diagnosis not present

## 2018-04-01 DIAGNOSIS — M25661 Stiffness of right knee, not elsewhere classified: Secondary | ICD-10-CM

## 2018-04-01 DIAGNOSIS — R2689 Other abnormalities of gait and mobility: Secondary | ICD-10-CM

## 2018-04-01 DIAGNOSIS — M6281 Muscle weakness (generalized): Secondary | ICD-10-CM

## 2018-04-01 NOTE — Therapy (Signed)
[place] MAIN [facility] SERVICES [address] [place], [place], [address] Phone: [phone]   Fax:  [phone]  Physical Therapy Treatment  Patient Details  Name: Sarah Friedman MRN: [id number] Date of Birth: [birth year] Referring Provider (PT): Priscille Kluver , [place]    Encounter Date: 04/01/2018  PT End of Session - 04/01/18 1024    Visit Number  14    Number of Visits  20    Date for PT Re-Evaluation  04/22/18    Authorization Type   6/10 start 12/16    PT Start Time  1015    PT Stop Time  1100    PT Time Calculation (min)  45 min    Equipment Utilized During Treatment  Gait belt    Activity Tolerance  Patient tolerated treatment well    Behavior During Therapy  [facility] for tasks assessed/performed       Past Medical History:  Diagnosis Date  . Arthritis   . Hypertension   . Vertigo     Past Surgical History:  Procedure Laterality Date  . CATARACT EXTRACTION W/PHACO Right 04/23/2017   Procedure: CATARACT EXTRACTION PHACO AND INTRAOCULAR LENS PLACEMENT (IOC);  Surgeon: Birder Robson, MD;  Location: [facility] ORS;  Service: Ophthalmology;  Laterality: Right;  [place] 00:43.8 AP% 18.3 CDE 8.05 Fluid Pack lot # [id number] H  . CATARACT EXTRACTION W/PHACO Left 05/14/2017   Procedure: CATARACT EXTRACTION PHACO AND INTRAOCULAR LENS PLACEMENT (IOC);  Surgeon: Birder Robson, MD;  Location: [facility] ORS;  Service: Ophthalmology;  Laterality: Left;  [place] 00:29.5 AP% 17.2 CDE 5.07 Fluid Pack Lot # [id number] H  . JOINT REPLACEMENT Left    TKR  . OVARIAN CYST SURGERY    . TONSILLECTOMY      There were no vitals filed for this visit.  Subjective Assessment - 04/01/18 1020    Subjective  Patient reports she did her exercises. Is feeling very stiff but not "painful". reports no falls since last session.     Pertinent History  Patient is a pleasant 85 year old female who received a R TKA on 01/09/18. PMH includes HTN, HLD, anemia, osteopenia, right ventricular  end diastolic pressure elevation. Patient presents with son, using cane for the first time. Was not walking with an AD prior to surgery. Has a history of L TKA. No pain medication being taken, just Tylenol. Had two week of home health and her son helped her with her home health HEP. Wants to return to playing with grandchildren.     Limitations  Lifting;Standing;Walking;House hold activities;Other (comment)    How long can you sit comfortably?  n/a    How long can you stand comfortably?  hasn't been standing much    How long can you walk comfortably?  only been able to walk 4 rounds of house    Diagnostic tests  sx    Patient Stated Goals  play with grandchildren, return to normal routine     Currently in Pain?  Yes    Pain Score  1     Pain Location  Knee    Pain Orientation  Right;Left    Pain Descriptors / Indicators  Aching;Burning    Pain Type  Surgical pain    Pain Onset  More than a month ago    Pain Frequency  Intermittent       NuStep, Seat 8, L4, x4 min to encourage controlled knee flexion/extension and even weight bearing through BLE.    Ambulate without AD, x900  feet No episodes of instability. Patient able to maintain symmetrical stride length with good weight shift side to side. Verbal cueing for heel strike of RLE with CGA throughout. One seated rest break with seated hamstring stretch performed to decrease stability.    4" step eccentric heel taps // bars. CGA with BUE support with tactile and verbal cueing for body mechanics and velocity.    4" step ups with BUE support, CGA with verbal cues for foot placement.    Seated hamstring stretch 2x 30 seconds  Each LE, foot on 4" step; 3 sets   Seated spelling the alphabet out with RLE for quadriceps strengthening and ankle control/stability.     Manual:              Hamstring stretch 2x60 seconds RLE             Hip aBduction stretch 2x30 seconds RLE             Medial and lateral quadriceps STM 2 minutes to increase  muscle tissue length of RLE for decreased pain  Prone hip flexor/quadriceps stretch 3x30 seconds                         PT Education - 04/01/18 1023    Education Details  exercise technique, mobility, eccentric step control    Person(s) Educated  Patient    Methods  Explanation;Demonstration;Tactile cues;Verbal cues    Comprehension  Verbalized understanding;Returned demonstration;Verbal cues required;Tactile cues required       PT Short Term Goals - 03/25/18 1531      PT SHORT TERM GOAL #1   Title  Patient will be independent in home exercise program to improve strength/mobility for better functional independence with ADLs    Baseline  compliance with HH HEP 12/16: compliant    Time  2    Period  Weeks    Status  Achieved      PT SHORT TERM GOAL #2   Title  Patient will perform sit to stand without UE suport to demonstrate improved LE strength and stability     Baseline  11/14: require BUE support 12/16: requires BUE support 1/14: BUE support     Time  2    Period  Weeks    Status  On-going    Target Date  04/08/18      PT SHORT TERM GOAL #3   Title  Patient will increase R knee AROM to 105 degrees flexion and -5 degrees extension for improved mobility and gait mechanics    Baseline  11/14: AROM: 95 flexion extension -10. 12/16: flexion 120, extension -3     Time  2    Period  Weeks    Status  Achieved      PT SHORT TERM GOAL #4   Title  Patient will increase R knee strength to 3/5 as to improve functional strength for independent gait, increased standing tolerance and increased ADL ability.    Baseline  11/14: 2+/5 12/16: 4-/5     Time  2    Period  Weeks    Status  Achieved        PT Long Term Goals - 03/25/18 1526      PT LONG TERM GOAL #1   Title  Patient will increase R LE strength to 4+/5 as to improve functional strength for independent gait, increased standing tolerance and increased ADL ability.    Baseline  11/14: R knee 2+/5  12/16:  4-/5  1/14: R hip 4/5 Knee 4-/5     Time  4    Period  Weeks    Status  Partially Met    Target Date  04/22/18      PT LONG TERM GOAL #2   Title  Patient will report a worst pain of 3/10 on VAS in  R knee  to improve tolerance with ADLs and reduced symptoms with activities.     Baseline  11/14: 7/10 worst pain  12/16: 5/10 1/14: 5/10     Time  4    Period  Weeks    Status  Partially Met    Target Date  04/22/18      PT LONG TERM GOAL #3   Title  Patient will increase R knee AROM to 120 degrees flexion and neutral extension for return to PLOF and natural ambulatory mechanics.     Baseline  11/14: R knee 95 flexion -10 extension 12/16: 120 flexion, -3 extension after stretch 1/14: >120 flexion, neutral extensions     Time  4    Period  Weeks    Status  Achieved      PT LONG TERM GOAL #4   Title  Patient (> 70 years old) will complete five times sit to stand test in < 15 seconds indicating an increased LE strength and improved balance.    Baseline  11/14: 22 seconds with excessive UE support and R knee forward 12/16: 14 seconds with BUE support 1/14: 13 seconds seconds BUE suppor     Time  4    Period  Weeks    Status  Partially Met    Target Date  04/22/18      PT LONG TERM GOAL #5   Title  Patient will increase lower extremity functional scale to >60/80 to demonstrate improved functional mobility and increased tolerance with ADLs.     Baseline  11/14: 40/80 12/16 : 35/80 1/14: 47/80     Time  4    Period  Weeks    Status  Partially Met    Target Date  04/22/18      PT LONG TERM GOAL #6   Title  Patient will increase 10 meter walk test to >1.29ms with least restrictive device to improve gait speed for better community ambulation and to reduce fall risk.    Baseline  11/14: .71 m/s with cane 12/16: .83 m/s without cane 1/14: .91 m/s without cane    Time  4    Period  Weeks    Status  Partially Met    Target Date  04/22/18      PT LONG TERM GOAL #7   Title  Patient will  increase six minute walk test distance to >1000 for progression to community ambulator and improve gait ability    Baseline  12/16: 798 ft with and without cane 1/14: 812 ft without cane    Time  4    Period  Weeks    Status  Partially Met    Target Date  04/22/18            Plan - 04/01/18 1122    Clinical Impression Statement  Patient demonstrated improved mobility on RLE with decreased pain. Stiffness was main complaint at this time with tenderness to lateral quadriceps region. Eccentric control continues to be an area of challenge to this patient due to the nature of muscle recruitment. Patient will continue to benefit from skilled therapeutic intervention to  address deficits in strength, mobility, balance and activity tolerance in order to return to her PLOF    Rehab Potential  Good    Clinical Impairments Affecting Rehab Potential  (+) family support, prior level of function, high pain tolerance (-) age    PT Frequency  2x / week    PT Duration  4 weeks    PT Treatment/Interventions  ADLs/Self Care Home Management;Aquatic Therapy;Cryotherapy;Electrical Stimulation;Iontophoresis 49m/ml Dexamethasone;Moist Heat;Traction;Ultrasound;DME Instruction;Balance training;Therapeutic exercise;Therapeutic activities;Functional mobility training;Stair training;Gait training;Neuromuscular re-education;Patient/family education;Scar mobilization;Compression bandaging;Manual lymph drainage;Manual techniques;Passive range of motion;Dry needling;Energy conservation;Splinting;Taping    PT Next Visit Plan  eccentric control     PT Home Exercise Plan  continue HEP     Consulted and Agree with Plan of Care  Patient;Family member/caregiver    Family Member Consulted  son       Patient will benefit from skilled therapeutic intervention in order to improve the following deficits and impairments:  Abnormal gait, Decreased activity tolerance, Decreased balance, Decreased endurance, Decreased coordination,  Decreased mobility, Decreased range of motion, Decreased scar mobility, Decreased strength, Difficulty walking, Increased edema, Hypomobility, Impaired perceived functional ability, Impaired flexibility, Improper body mechanics, Postural dysfunction, Pain  Visit Diagnosis: Acute pain of right knee  Stiffness of right knee, not elsewhere classified  Other abnormalities of gait and mobility  Muscle weakness (generalized)     Problem List There are no active problems to display for this patient.  MJanna Arch PT, DPT   04/01/2018, 11:26 AM  [place] [facility]trSERVICES [address] [address]Phone: [phone]  Fax:  [phone] Name: Sarah FISCHLERMRN: [id number] of Birth: [birth year]

## 2018-04-03 ENCOUNTER — Ambulatory Visit: Payer: Medicare Other

## 2018-04-03 DIAGNOSIS — M25561 Pain in right knee: Secondary | ICD-10-CM | POA: Diagnosis not present

## 2018-04-03 DIAGNOSIS — M25661 Stiffness of right knee, not elsewhere classified: Secondary | ICD-10-CM

## 2018-04-03 DIAGNOSIS — M6281 Muscle weakness (generalized): Secondary | ICD-10-CM

## 2018-04-03 DIAGNOSIS — R2689 Other abnormalities of gait and mobility: Secondary | ICD-10-CM

## 2018-04-03 NOTE — Therapy (Signed)
[place] MAIN [facility] SERVICES [address] [place], [place], [address] Phone: [phone]   Fax:  [phone]  Physical Therapy Treatment  Patient Details  Name: Sarah Friedman MRN: [id number] Date of Birth: [birth year] Referring Provider (PT): Priscille Kluver , [place]    Encounter Date: 04/03/2018  PT End of Session - 04/03/18 1347    Visit Number  15    Number of Visits  20    Date for PT Re-Evaluation  04/22/18    Authorization Type   7/10 start 12/16    PT Start Time  1343    PT Stop Time  1429    PT Time Calculation (min)  46 min    Equipment Utilized During Treatment  Gait belt    Activity Tolerance  Patient tolerated treatment well    Behavior During Therapy  [facility] for tasks assessed/performed       Past Medical History:  Diagnosis Date  . Arthritis   . Hypertension   . Vertigo     Past Surgical History:  Procedure Laterality Date  . CATARACT EXTRACTION W/PHACO Right 04/23/2017   Procedure: CATARACT EXTRACTION PHACO AND INTRAOCULAR LENS PLACEMENT (IOC);  Surgeon: Birder Robson, MD;  Location: [facility] ORS;  Service: Ophthalmology;  Laterality: Right;  [place] 00:43.8 AP% 18.3 CDE 8.05 Fluid Pack lot # [id number] H  . CATARACT EXTRACTION W/PHACO Left 05/14/2017   Procedure: CATARACT EXTRACTION PHACO AND INTRAOCULAR LENS PLACEMENT (IOC);  Surgeon: Birder Robson, MD;  Location: [facility] ORS;  Service: Ophthalmology;  Laterality: Left;  [place] 00:29.5 AP% 17.2 CDE 5.07 Fluid Pack Lot # [id number] H  . JOINT REPLACEMENT Left    TKR  . OVARIAN CYST SURGERY    . TONSILLECTOMY      There were no vitals filed for this visit.  Subjective Assessment - 04/03/18 1345    Subjective  Patient states that today is having a good day. Notices having pain intermittantly in R leg when sitting in one place.  Has been compliant with HEP.     Pertinent History  Patient is a pleasant 85 year old female who received a R TKA on 01/09/18. PMH includes HTN, HLD,  anemia, osteopenia, right ventricular end diastolic pressure elevation. Patient presents with son, using cane for the first time. Was not walking with an AD prior to surgery. Has a history of L TKA. No pain medication being taken, just Tylenol. Had two week of home health and her son helped her with her home health HEP. Wants to return to playing with grandchildren.     Limitations  Lifting;Standing;Walking;House hold activities;Other (comment)    How long can you sit comfortably?  n/a    How long can you stand comfortably?  hasn't been standing much    How long can you walk comfortably?  only been able to walk 4 rounds of house    Diagnostic tests  sx    Patient Stated Goals  play with grandchildren, return to normal routine     Currently in Pain?  No/denies       NuStep, Seat 8, L4, x4 min to encourage controlled knee flexion/extension and even weight bearing through BLE.    Ambulate without AD, x900 feet No episodes of instability. Patient able to maintain symmetrical stride length with good weight shift side to side. Verbal cueing for heel strike of RLE with CGA throughout. One seated rest break with seated hamstring stretch performed to decrease stability.    6" step eccentric heel taps //  bars. CGA with BUE support with tactile and verbal cueing for body mechanics and velocity.     6" step ups with SUE support, CGA with verbal cues for foot placement.   6" side step ups with BUE support, verbal cueing for sequencing and task orientation.    Seated hamstring stretch 2x 30 seconds  Each LE, foot on 4" step; 3 sets    Seated spelling the alphabet out with RLE for quadriceps strengthening and ankle control/stability.   Airex pad: balloon taps reaching inside and outside BOS         Manual:              Hamstring stretch 2x60 seconds RLE             Hip aBduction stretch 2x30 seconds RLE             Medial and lateral quadriceps STM 2 minutes to increase muscle tissue length of RLE  for decreased pain             Prone hip flexor/quadriceps stretch 3x30 seconds  Gave new HEP:   Access Code: [email or web address]  URL: [email or web address]  Date: 04/02/2018  Prepared by: Janna Arch   Exercises  Standing March with Unilateral Counter Support - 10 reps - 2 sets - 5 hold - 1x daily - 7x weekly  Standing Hip Abduction with Counter Support - 10 reps - 2 sets - 5 hold - 1x daily - 7x weekly  Standing Hip Extension with Counter Support - 10 reps - 2 sets - 5 hold - 1x daily - 7x weekly  Seated Heel Toe Raises - 10 reps - 2 sets - 5 hold - 1x daily - 7x weekly  Seated Long Arc Quad - 10 reps - 2 sets - 5 hold - 1x daily - 7x weekly  Standing Tandem Balance with Counter Support - 2 reps - 1 sets - 30 hold - 1x daily - 7x weekly  Sit to Stand with Armchair - 10 reps - 2 sets - 5 hold - 1x daily - 7x weekly                        PT Education - 04/03/18 1346    Education Details  exercise technique, mobility, eccentric muscle control     Person(s) Educated  Patient    Methods  Explanation;Demonstration;Tactile cues;Verbal cues    Comprehension  Verbalized understanding;Returned demonstration;Tactile cues required;Need further instruction;Verbal cues required       PT Short Term Goals - 03/25/18 1531      PT SHORT TERM GOAL #1   Title  Patient will be independent in home exercise program to improve strength/mobility for better functional independence with ADLs    Baseline  compliance with HH HEP 12/16: compliant    Time  2    Period  Weeks    Status  Achieved      PT SHORT TERM GOAL #2   Title  Patient will perform sit to stand without UE suport to demonstrate improved LE strength and stability     Baseline  11/14: require BUE support 12/16: requires BUE support 1/14: BUE support     Time  2    Period  Weeks    Status  On-going    Target Date  04/08/18      PT SHORT TERM GOAL #3   Title  Patient will increase R knee AROM to 105 degrees  flexion and -  5 degrees extension for improved mobility and gait mechanics    Baseline  11/14: AROM: 95 flexion extension -10. 12/16: flexion 120, extension -3     Time  2    Period  Weeks    Status  Achieved      PT SHORT TERM GOAL #4   Title  Patient will increase R knee strength to 3/5 as to improve functional strength for independent gait, increased standing tolerance and increased ADL ability.    Baseline  11/14: 2+/5 12/16: 4-/5     Time  2    Period  Weeks    Status  Achieved        PT Long Term Goals - 03/25/18 1526      PT LONG TERM GOAL #1   Title  Patient will increase R LE strength to 4+/5 as to improve functional strength for independent gait, increased standing tolerance and increased ADL ability.    Baseline  11/14: R knee 2+/5 12/16: 4-/5  1/14: R hip 4/5 Knee 4-/5     Time  4    Period  Weeks    Status  Partially Met    Target Date  04/22/18      PT LONG TERM GOAL #2   Title  Patient will report a worst pain of 3/10 on VAS in  R knee  to improve tolerance with ADLs and reduced symptoms with activities.     Baseline  11/14: 7/10 worst pain  12/16: 5/10 1/14: 5/10     Time  4    Period  Weeks    Status  Partially Met    Target Date  04/22/18      PT LONG TERM GOAL #3   Title  Patient will increase R knee AROM to 120 degrees flexion and neutral extension for return to PLOF and natural ambulatory mechanics.     Baseline  11/14: R knee 95 flexion -10 extension 12/16: 120 flexion, -3 extension after stretch 1/14: >120 flexion, neutral extensions     Time  4    Period  Weeks    Status  Achieved      PT LONG TERM GOAL #4   Title  Patient (> 47 years old) will complete five times sit to stand test in < 15 seconds indicating an increased LE strength and improved balance.    Baseline  11/14: 22 seconds with excessive UE support and R knee forward 12/16: 14 seconds with BUE support 1/14: 13 seconds seconds BUE suppor     Time  4    Period  Weeks    Status  Partially  Met    Target Date  04/22/18      PT LONG TERM GOAL #5   Title  Patient will increase lower extremity functional scale to >60/80 to demonstrate improved functional mobility and increased tolerance with ADLs.     Baseline  11/14: 40/80 12/16 : 35/80 1/14: 47/80     Time  4    Period  Weeks    Status  Partially Met    Target Date  04/22/18      PT LONG TERM GOAL #6   Title  Patient will increase 10 meter walk test to >1.75ms with least restrictive device to improve gait speed for better community ambulation and to reduce fall risk.    Baseline  11/14: .71 m/s with cane 12/16: .83 m/s without cane 1/14: .91 m/s without cane    Time  4  Period  Weeks    Status  Partially Met    Target Date  04/22/18      PT LONG TERM GOAL #7   Title  Patient will increase six minute walk test distance to >1000 for progression to community ambulator and improve gait ability    Baseline  12/16: 798 ft with and without cane 1/14: 812 ft without cane    Time  4    Period  Weeks    Status  Partially Met    Target Date  04/22/18            Plan - 04/03/18 1751    Clinical Impression Statement  Patient presents with good motivation throughout session. New HEP given to patient for progression of home program and to encourage standing in home in a safe manner. Stiffness continues to be a main concern with challenge for eccentric control. Patient will continue to benefit from skilled therapeutic intervention to address deficits in strength, mobility, balance and activity tolerance in order to return to her PLOF    Rehab Potential  Good    Clinical Impairments Affecting Rehab Potential  (+) family support, prior level of function, high pain tolerance (-) age    PT Frequency  2x / week    PT Duration  4 weeks    PT Treatment/Interventions  ADLs/Self Care Home Management;Aquatic Therapy;Cryotherapy;Electrical Stimulation;Iontophoresis 40m/ml Dexamethasone;Moist Heat;Traction;Ultrasound;DME  Instruction;Balance training;Therapeutic exercise;Therapeutic activities;Functional mobility training;Stair training;Gait training;Neuromuscular re-education;Patient/family education;Scar mobilization;Compression bandaging;Manual lymph drainage;Manual techniques;Passive range of motion;Dry needling;Energy conservation;Splinting;Taping    PT Next Visit Plan  eccentric control     PT Home Exercise Plan  continue HEP     Consulted and Agree with Plan of Care  Patient;Family member/caregiver    Family Member Consulted  son       Patient will benefit from skilled therapeutic intervention in order to improve the following deficits and impairments:  Abnormal gait, Decreased activity tolerance, Decreased balance, Decreased endurance, Decreased coordination, Decreased mobility, Decreased range of motion, Decreased scar mobility, Decreased strength, Difficulty walking, Increased edema, Hypomobility, Impaired perceived functional ability, Impaired flexibility, Improper body mechanics, Postural dysfunction, Pain  Visit Diagnosis: Acute pain of right knee  Stiffness of right knee, not elsewhere classified  Other abnormalities of gait and mobility  Muscle weakness (generalized)     Problem List There are no active problems to display for this patient.  MJanna Arch PT, DPT   04/03/2018, 5:52 PM  [place] [facility] [address] [address]Phone: [phone]  Fax:  [phone] Name: Sarah CULLIFERMRN: [id number] of Birth: 1May 05, 1935

## 2018-04-08 ENCOUNTER — Ambulatory Visit: Payer: Medicare Other

## 2018-04-08 DIAGNOSIS — M25561 Pain in right knee: Secondary | ICD-10-CM

## 2018-04-08 DIAGNOSIS — M6281 Muscle weakness (generalized): Secondary | ICD-10-CM

## 2018-04-08 DIAGNOSIS — R2689 Other abnormalities of gait and mobility: Secondary | ICD-10-CM

## 2018-04-08 DIAGNOSIS — M25661 Stiffness of right knee, not elsewhere classified: Secondary | ICD-10-CM

## 2018-04-08 NOTE — Therapy (Signed)
[place] MAIN [facility] SERVICES [address] [place], [place], [address] Phone: [phone]   Fax:  [phone]  Physical Therapy Treatment  Patient Details  Name: Sarah Friedman MRN: [id number] Date of Birth: 07-06-1933 Referring Provider (PT): Priscille Kluver , [place]    Encounter Date: 04/08/2018  PT End of Session - 04/08/18 1428    Visit Number  16    Number of Visits  20    Date for PT Re-Evaluation  04/22/18    Authorization Type   8/10 start 12/16    PT Start Time  1345    PT Stop Time  1429    PT Time Calculation (min)  44 min    Equipment Utilized During Treatment  Gait belt    Activity Tolerance  Patient tolerated treatment well    Behavior During Therapy  [facility] for tasks assessed/performed       Past Medical History:  Diagnosis Date  . Arthritis   . Hypertension   . Vertigo     Past Surgical History:  Procedure Laterality Date  . CATARACT EXTRACTION W/PHACO Right 04/23/2017   Procedure: CATARACT EXTRACTION PHACO AND INTRAOCULAR LENS PLACEMENT (IOC);  Surgeon: Birder Robson, MD;  Location: [facility] ORS;  Service: Ophthalmology;  Laterality: Right;  [place] 00:43.8 AP% 18.3 CDE 8.05 Fluid Pack lot # [id number] H  . CATARACT EXTRACTION W/PHACO Left 05/14/2017   Procedure: CATARACT EXTRACTION PHACO AND INTRAOCULAR LENS PLACEMENT (IOC);  Surgeon: Birder Robson, MD;  Location: [facility] ORS;  Service: Ophthalmology;  Laterality: Left;  [place] 00:29.5 AP% 17.2 CDE 5.07 Fluid Pack Lot # [id number] H  . JOINT REPLACEMENT Left    TKR  . OVARIAN CYST SURGERY    . TONSILLECTOMY      There were no vitals filed for this visit.  Subjective Assessment - 04/08/18 1351    Subjective  Patient reports she has been doing well. Has been doing her HEP. No falls or LOB. Patient has had a lot of company this weekend.     Pertinent History  Patient is a pleasant 85 year old female who received a R TKA on 01/09/18. PMH includes HTN, HLD, anemia, osteopenia, right  ventricular end diastolic pressure elevation. Patient presents with son, using cane for the first time. Was not walking with an AD prior to surgery. Has a history of L TKA. No pain medication being taken, just Tylenol. Had two week of home health and her son helped her with her home health HEP. Wants to return to playing with grandchildren.     Limitations  Lifting;Standing;Walking;House hold activities;Other (comment)    How long can you sit comfortably?  n/a    How long can you stand comfortably?  hasn't been standing much    How long can you walk comfortably?  only been able to walk 4 rounds of house    Diagnostic tests  sx    Patient Stated Goals  play with grandchildren, return to normal routine     Currently in Pain?  No/denies      TREATMENT:   4" step eccentric heel taps // bars. CGA with BUE support with tactile and verbal cueing for body mechanics and velocity.     4" step ups with no UE support, CGA with verbal cues for foot placement. 12x each,    4" side step ups with no UE support, verbal cueing for sequencing and task orientation, occasional LOB 15x.    Seated hamstring stretch 2x 30 seconds  Each  LE, foot on 4" step; 3 sets    Seated spelling the alphabet out with RLE for quadriceps strengthening and ankle control/stability.    Ambulate in hallway with horizontal head turns reading playing cards 86 ft CGA Ambulate in hallway with vertical head nods, CGA one episodes of LOB, CGA 86 ft.    Quantum leg press:   Double leg: 90 lb. 12x, 2 sets, requires initial assistance with first push. Verbal cueing for sequencing    RLE only: 45 lb; 12x 2 sets;   hamstring stretch supine RLE 60 seconds IT band rollout/lateral quad RLE 2 minutes  Patient requires verbal cueing for task orientation and sequencing with CGA in standing for stability.                     PT Education - 04/08/18 1416    Education Details  exercise technique, mobility    Person(s)  Educated  Patient    Methods  Explanation;Demonstration;Tactile cues;Verbal cues    Comprehension  Verbalized understanding;Returned demonstration;Verbal cues required;Tactile cues required;Need further instruction       PT Short Term Goals - 03/25/18 1531      PT SHORT TERM GOAL #1   Title  Patient will be independent in home exercise program to improve strength/mobility for better functional independence with ADLs    Baseline  compliance with HH HEP 12/16: compliant    Time  2    Period  Weeks    Status  Achieved      PT SHORT TERM GOAL #2   Title  Patient will perform sit to stand without UE suport to demonstrate improved LE strength and stability     Baseline  11/14: require BUE support 12/16: requires BUE support 1/14: BUE support     Time  2    Period  Weeks    Status  On-going    Target Date  04/08/18      PT SHORT TERM GOAL #3   Title  Patient will increase R knee AROM to 105 degrees flexion and -5 degrees extension for improved mobility and gait mechanics    Baseline  11/14: AROM: 95 flexion extension -10. 12/16: flexion 120, extension -3     Time  2    Period  Weeks    Status  Achieved      PT SHORT TERM GOAL #4   Title  Patient will increase R knee strength to 3/5 as to improve functional strength for independent gait, increased standing tolerance and increased ADL ability.    Baseline  11/14: 2+/5 12/16: 4-/5     Time  2    Period  Weeks    Status  Achieved        PT Long Term Goals - 03/25/18 1526      PT LONG TERM GOAL #1   Title  Patient will increase R LE strength to 4+/5 as to improve functional strength for independent gait, increased standing tolerance and increased ADL ability.    Baseline  11/14: R knee 2+/5 12/16: 4-/5  1/14: R hip 4/5 Knee 4-/5     Time  4    Period  Weeks    Status  Partially Met    Target Date  04/22/18      PT LONG TERM GOAL #2   Title  Patient will report a worst pain of 3/10 on VAS in  R knee  to improve tolerance with  ADLs and reduced symptoms with activities.  Baseline  11/14: 7/10 worst pain  12/16: 5/10 1/14: 5/10     Time  4    Period  Weeks    Status  Partially Met    Target Date  04/22/18      PT LONG TERM GOAL #3   Title  Patient will increase R knee AROM to 120 degrees flexion and neutral extension for return to PLOF and natural ambulatory mechanics.     Baseline  11/14: R knee 95 flexion -10 extension 12/16: 120 flexion, -3 extension after stretch 1/14: >120 flexion, neutral extensions     Time  4    Period  Weeks    Status  Achieved      PT LONG TERM GOAL #4   Title  Patient (> 39 years old) will complete five times sit to stand test in < 15 seconds indicating an increased LE strength and improved balance.    Baseline  11/14: 22 seconds with excessive UE support and R knee forward 12/16: 14 seconds with BUE support 1/14: 13 seconds seconds BUE suppor     Time  4    Period  Weeks    Status  Partially Met    Target Date  04/22/18      PT LONG TERM GOAL #5   Title  Patient will increase lower extremity functional scale to >60/80 to demonstrate improved functional mobility and increased tolerance with ADLs.     Baseline  11/14: 40/80 12/16 : 35/80 1/14: 47/80     Time  4    Period  Weeks    Status  Partially Met    Target Date  04/22/18      PT LONG TERM GOAL #6   Title  Patient will increase 10 meter walk test to >1.39ms with least restrictive device to improve gait speed for better community ambulation and to reduce fall risk.    Baseline  11/14: .71 m/s with cane 12/16: .83 m/s without cane 1/14: .91 m/s without cane    Time  4    Period  Weeks    Status  Partially Met    Target Date  04/22/18      PT LONG TERM GOAL #7   Title  Patient will increase six minute walk test distance to >1000 for progression to community ambulator and improve gait ability    Baseline  12/16: 798 ft with and without cane 1/14: 812 ft without cane    Time  4    Period  Weeks    Status  Partially Met     Target Date  04/22/18            Plan - 04/08/18 1429    Clinical Impression Statement  Patient presents with good motivation to today's session. Improved strength and coordination continues to be noted with decreased need for UE support. Will assess progression next session to determine future POC due to presentation this session. Patient will continue to benefit from skilled therapeutic intervention to address deficits in strength, mobility, balance and activity tolerance in order to return to her PLOF    Rehab Potential  Good    Clinical Impairments Affecting Rehab Potential  (+) family support, prior level of function, high pain tolerance (-) age    PT Frequency  2x / week    PT Duration  4 weeks    PT Treatment/Interventions  ADLs/Self Care Home Management;Aquatic Therapy;Cryotherapy;Electrical Stimulation;Iontophoresis 435mml Dexamethasone;Moist Heat;Traction;Ultrasound;DME Instruction;Balance training;Therapeutic exercise;Therapeutic activities;Functional mobility training;Stair training;Gait training;Neuromuscular re-education;Patient/family  education;Scar mobilization;Compression bandaging;Manual lymph drainage;Manual techniques;Passive range of motion;Dry needling;Energy conservation;Splinting;Taping    PT Next Visit Plan  eccentric control     PT Home Exercise Plan  continue HEP     Consulted and Agree with Plan of Care  Patient;Family member/caregiver    Family Member Consulted  son       Patient will benefit from skilled therapeutic intervention in order to improve the following deficits and impairments:  Abnormal gait, Decreased activity tolerance, Decreased balance, Decreased endurance, Decreased coordination, Decreased mobility, Decreased range of motion, Decreased scar mobility, Decreased strength, Difficulty walking, Increased edema, Hypomobility, Impaired perceived functional ability, Impaired flexibility, Improper body mechanics, Postural dysfunction, Pain  Visit  Diagnosis: Acute pain of right knee  Stiffness of right knee, not elsewhere classified  Other abnormalities of gait and mobility  Muscle weakness (generalized)     Problem List There are no active problems to display for this patient.  Janna Arch, PT, DPT   04/08/2018, 2:30 PM  [place] MAIN [facility] SERVICES [address] [place], [place], [address] Phone: [phone]   Fax:  [phone]  Name: Sarah Friedman MRN: [id number] Date of Birth: 12/03/1933

## 2018-04-10 ENCOUNTER — Ambulatory Visit: Payer: Medicare Other

## 2018-04-10 DIAGNOSIS — M25561 Pain in right knee: Secondary | ICD-10-CM

## 2018-04-10 DIAGNOSIS — M25661 Stiffness of right knee, not elsewhere classified: Secondary | ICD-10-CM

## 2018-04-10 DIAGNOSIS — R2689 Other abnormalities of gait and mobility: Secondary | ICD-10-CM

## 2018-04-10 DIAGNOSIS — M6281 Muscle weakness (generalized): Secondary | ICD-10-CM

## 2018-04-10 NOTE — Therapy (Signed)
[place] MAIN [facility] SERVICES [address] [place], [place], [address] Phone: [phone]   Fax:  [phone]  Physical Therapy Treatment  Patient Details  Name: Sarah Friedman MRN: [id number] Date of Birth: 1933-04-01 Referring Provider (PT): Priscille Kluver , [place]    Encounter Date: 04/10/2018  PT End of Session - 04/10/18 1350    Visit Number  17    Number of Visits  20    Date for PT Re-Evaluation  04/22/18    Authorization Type   8/10 start 12/16    PT Start Time  1340    PT Stop Time  1420    PT Time Calculation (min)  40 min    Activity Tolerance  Patient tolerated treatment well    Behavior During Therapy  [facility] for tasks assessed/performed       Past Medical History:  Diagnosis Date  . Arthritis   . Hypertension   . Vertigo     Past Surgical History:  Procedure Laterality Date  . CATARACT EXTRACTION W/PHACO Right 04/23/2017   Procedure: CATARACT EXTRACTION PHACO AND INTRAOCULAR LENS PLACEMENT (IOC);  Surgeon: Birder Robson, MD;  Location: [facility] ORS;  Service: Ophthalmology;  Laterality: Right;  [place] 00:43.8 AP% 18.3 CDE 8.05 Fluid Pack lot # [id number]  . CATARACT EXTRACTION W/PHACO Left 05/14/2017   Procedure: CATARACT EXTRACTION PHACO AND INTRAOCULAR LENS PLACEMENT (IOC);  Surgeon: Birder Robson, MD;  Location: [facility] ORS;  Service: Ophthalmology;  Laterality: Left;  [place] 00:29.5 AP% 17.2 CDE 5.07 Fluid Pack Lot # [id number]  . JOINT REPLACEMENT Left    TKR  . OVARIAN CYST SURGERY    . TONSILLECTOMY      There were no vitals filed for this visit.  Subjective Assessment - 04/10/18 1348    Subjective  Pt doign well today. Reports she has had lots of soreness in her distal lateral Rt knee since aplication of silver roller last date. Her HEP is going well.     Pertinent History  Patient is a pleasant 85 year old female who received a R TKA on 01/09/18. PMH includes HTN, HLD, anemia, osteopenia, right ventricular end  diastolic pressure elevation. Patient presents with son, using cane for the first time. Was not walking with an AD prior to surgery. Has a history of L TKA. No pain medication being taken, just Tylenol. Had two week of home health and her son helped her with her home health HEP. Wants to return to playing with grandchildren.     Currently in Pain?  Yes    Pain Score  5     Pain Location  Knee    Pain Orientation  Right;Distal;Lateral       TREATMENT This Date:  -Seated Hamstrings Stretch 2x30sec (heavy verbal cuing required for correct performs, pt struggling to follow commands, reports she's having a hard time paying attention) -Seated LAQ 2x15 -Standing Bilat Heel Raises 2x15 -STS from Plinth (slight elevation) hands across chest, 2x10   -4" FWD step ups with no UE support, CGA with verbal cues for foot placement. 1x8 bilat, heavy verbal/tactile cues to sequence steps as directed.   -4" side step up/down s UE support, minGuard assist, no LOB.    -AMB in hallwith head turns, verbal identification of playing cards as seen:  4x65f  -1&2 naming number  -3 naming color + number -4 naming suite only (cognitive difficulty, more error) *no LOB during activity, but pt does have variable gait speed when challenged.  -  Quantum leg press, seat-hole 6:             -Double leg: 90 lb. 12x, 2 sets, VC for force through heel and tactile cues for TKE Rt.             (cues for full range each rep)   PT Short Term Goals - 03/25/18 1531      PT SHORT TERM GOAL #1   Title  Patient will be independent in home exercise program to improve strength/mobility for better functional independence with ADLs    Baseline  compliance with HH HEP 12/16: compliant    Time  2    Period  Weeks    Status  Achieved      PT SHORT TERM GOAL #2   Title  Patient will perform sit to stand without UE suport to demonstrate improved LE strength and stability     Baseline  11/14: require BUE support 12/16: requires BUE  support 1/14: BUE support     Time  2    Period  Weeks    Status  On-going    Target Date  04/08/18      PT SHORT TERM GOAL #3   Title  Patient will increase R knee AROM to 105 degrees flexion and -5 degrees extension for improved mobility and gait mechanics    Baseline  11/14: AROM: 95 flexion extension -10. 12/16: flexion 120, extension -3     Time  2    Period  Weeks    Status  Achieved      PT SHORT TERM GOAL #4   Title  Patient will increase R knee strength to 3/5 as to improve functional strength for independent gait, increased standing tolerance and increased ADL ability.    Baseline  11/14: 2+/5 12/16: 4-/5     Time  2    Period  Weeks    Status  Achieved        PT Long Term Goals - 03/25/18 1526      PT LONG TERM GOAL #1   Title  Patient will increase R LE strength to 4+/5 as to improve functional strength for independent gait, increased standing tolerance and increased ADL ability.    Baseline  11/14: R knee 2+/5 12/16: 4-/5  1/14: R hip 4/5 Knee 4-/5     Time  4    Period  Weeks    Status  Partially Met    Target Date  04/22/18      PT LONG TERM GOAL #2   Title  Patient will report a worst pain of 3/10 on VAS in  R knee  to improve tolerance with ADLs and reduced symptoms with activities.     Baseline  11/14: 7/10 worst pain  12/16: 5/10 1/14: 5/10     Time  4    Period  Weeks    Status  Partially Met    Target Date  04/22/18      PT LONG TERM GOAL #3   Title  Patient will increase R knee AROM to 120 degrees flexion and neutral extension for return to PLOF and natural ambulatory mechanics.     Baseline  11/14: R knee 95 flexion -10 extension 12/16: 120 flexion, -3 extension after stretch 1/14: >120 flexion, neutral extensions     Time  4    Period  Weeks    Status  Achieved      PT LONG TERM GOAL #4   Title  Patient (> 47 years old) will complete five times sit to stand test in < 15 seconds indicating an increased LE strength and improved balance.     Baseline  11/14: 22 seconds with excessive UE support and R knee forward 12/16: 14 seconds with BUE support 1/14: 13 seconds seconds BUE suppor     Time  4    Period  Weeks    Status  Partially Met    Target Date  04/22/18      PT LONG TERM GOAL #5   Title  Patient will increase lower extremity functional scale to >60/80 to demonstrate improved functional mobility and increased tolerance with ADLs.     Baseline  11/14: 40/80 12/16 : 35/80 1/14: 47/80     Time  4    Period  Weeks    Status  Partially Met    Target Date  04/22/18      PT LONG TERM GOAL #6   Title  Patient will increase 10 meter walk test to >1.71ms with least restrictive device to improve gait speed for better community ambulation and to reduce fall risk.    Baseline  11/14: .71 m/s with cane 12/16: .83 m/s without cane 1/14: .91 m/s without cane    Time  4    Period  Weeks    Status  Partially Met    Target Date  04/22/18      PT LONG TERM GOAL #7   Title  Patient will increase six minute walk test distance to >1000 for progression to community ambulator and improve gait ability    Baseline  12/16: 798 ft with and without cane 1/14: 812 ft without cane    Time  4    Period  Weeks    Status  Partially Met    Target Date  04/22/18            Plan - 04/10/18 1351    Clinical Impression Statement  Continued with plan of care, working toward improved motor control in RLE with transfers and gait based activity, as well as improving tolerance and quality of A/ROM. Pt requires heavy verbal cuing for instruction of activities, sometimes has difficulty with complex instructions for movement quality. Overall she remains in good spirits, enthusiastic, and very in tune with how her body responds to various exercises.     Rehab Potential  Good    Clinical Impairments Affecting Rehab Potential  (+) family support, prior level of function, high pain tolerance (-) age    PT Frequency  2x / week    PT Duration  4 weeks    PT  Treatment/Interventions  ADLs/Self Care Home Management;Aquatic Therapy;Cryotherapy;Electrical Stimulation;Iontophoresis 468mml Dexamethasone;Moist Heat;Traction;Ultrasound;DME Instruction;Balance training;Therapeutic exercise;Therapeutic activities;Functional mobility training;Stair training;Gait training;Neuromuscular re-education;Patient/family education;Scar mobilization;Compression bandaging;Manual lymph drainage;Manual techniques;Passive range of motion;Dry needling;Energy conservation;Splinting;Taping    PT Next Visit Plan  eccentric control     PT Home Exercise Plan  continue HEP     Consulted and Agree with Plan of Care  Patient       Patient will benefit from skilled therapeutic intervention in order to improve the following deficits and impairments:  Abnormal gait, Decreased activity tolerance, Decreased balance, Decreased endurance, Decreased coordination, Decreased mobility, Decreased range of motion, Decreased scar mobility, Decreased strength, Difficulty walking, Increased edema, Hypomobility, Impaired perceived functional ability, Impaired flexibility, Improper body mechanics, Postural dysfunction, Pain  Visit Diagnosis: Acute pain of right knee  Stiffness of right knee, not elsewhere classified  Other abnormalities of  gait and mobility  Muscle weakness (generalized)     Problem List There are no active problems to display for this patient.  2:18 PM, 04/10/18 Etta Grandchild, PT, DPT Physical Therapist - [facility]  Outpatient Physical [place] [phone]     Etta Grandchild 04/10/2018, 2:13 PM  [place] MAIN [facility] SERVICES [address] [place], [place], [address] Phone: [phone]   Fax:  [phone]  Name: EMLYN MAVES MRN: [id number] Date of Birth: 1933/12/24

## 2018-04-14 ENCOUNTER — Ambulatory Visit: Payer: Medicare Other | Attending: Infectious Diseases

## 2018-04-14 DIAGNOSIS — M6281 Muscle weakness (generalized): Secondary | ICD-10-CM

## 2018-04-14 DIAGNOSIS — M25561 Pain in right knee: Secondary | ICD-10-CM | POA: Insufficient documentation

## 2018-04-14 DIAGNOSIS — R2689 Other abnormalities of gait and mobility: Secondary | ICD-10-CM

## 2018-04-14 DIAGNOSIS — M25661 Stiffness of right knee, not elsewhere classified: Secondary | ICD-10-CM | POA: Insufficient documentation

## 2018-04-14 NOTE — Therapy (Signed)
[place] MAIN [facility] SERVICES [address] [place], [place], [address] Phone: [phone]   Fax:  [phone]  Physical Therapy Treatment Physical Therapy Progress Note   Dates of reporting period  02/24/18   to  04/14/18   Patient Details  Name: Sarah Friedman MRN: [id number] Date of Birth: Jul 20, 1933 Referring Provider (PT): Priscille Kluver , [place]    Encounter Date: 04/14/2018  PT End of Session - 04/14/18 1745    Visit Number  18    Number of Visits  20    Date for PT Re-Evaluation  04/22/18    Authorization Type   10/10 start 12/16 (next 1/10 starting 2/3)     PT Start Time  1301    PT Stop Time  1345    PT Time Calculation (min)  44 min    Equipment Utilized During Treatment  Gait belt    Activity Tolerance  Patient tolerated treatment well    Behavior During Therapy  WFL for tasks assessed/performed       Past Medical History:  Diagnosis Date  . Arthritis   . Hypertension   . Vertigo     Past Surgical History:  Procedure Laterality Date  . CATARACT EXTRACTION W/PHACO Right 04/23/2017   Procedure: CATARACT EXTRACTION PHACO AND INTRAOCULAR LENS PLACEMENT (IOC);  Surgeon: Birder Robson, MD;  Location: [facility] ORS;  Service: Ophthalmology;  Laterality: Right;  [place] 00:43.8 AP% 18.3 CDE 8.05 Fluid Pack lot # [id number] H  . CATARACT EXTRACTION W/PHACO Left 05/14/2017   Procedure: CATARACT EXTRACTION PHACO AND INTRAOCULAR LENS PLACEMENT (IOC);  Surgeon: Birder Robson, MD;  Location: [facility] ORS;  Service: Ophthalmology;  Laterality: Left;  [place] 00:29.5 AP% 17.2 CDE 5.07 Fluid Pack Lot # [id number] H  . JOINT REPLACEMENT Left    TKR  . OVARIAN CYST SURGERY    . TONSILLECTOMY      There were no vitals filed for this visit.  Subjective Assessment - 04/14/18 1349    Subjective  Patient reports her R knee is sore with some tenderness and swelling.     Pertinent History  Patient is a pleasant 85 year old female who received a R TKA on  01/09/18. PMH includes HTN, HLD, anemia, osteopenia, right ventricular end diastolic pressure elevation. Patient presents with son, using cane for the first time. Was not walking with an AD prior to surgery. Has a history of L TKA. No pain medication being taken, just Tylenol. Had two week of home health and her son helped her with her home health HEP. Wants to return to playing with grandchildren.     Currently in Pain?  Yes    Pain Score  2     Pain Location  Knee    Pain Orientation  Right;Lateral    Pain Descriptors / Indicators  Aching    Pain Type  Surgical pain        Progress note Goals:  LEFS: 51/80 6 MWT : 910 ft no cane, CGA 5x STS: 10 seconds BUE support, 12 seconds SUE support. VAS:3/10 10MWT: 1.0 m/s    Ice cup massage to R knee for reduction of swelling of lateral meniscus/ant tib origin.    Patient's condition has the potential to improve in response to therapy. Maximum improvement is yet to be obtained. The anticipated improvement is attainable and reasonable in a generally predictable time.  Patient reports she is moving much better with decreased reliance upon her cane. Is now having some increased pain on  lateral inferior pole of knee.                   PT Education - 04/14/18 1744    Education Details  icing, goals, mobility    Person(s) Educated  Patient    Methods  Explanation;Demonstration;Tactile cues;Verbal cues    Comprehension  Verbalized understanding;Returned demonstration;Tactile cues required;Need further instruction;Verbal cues required       PT Short Term Goals - 04/14/18 1748      PT SHORT TERM GOAL #1   Title  Patient will be independent in home exercise program to improve strength/mobility for better functional independence with ADLs    Baseline  compliance with HH HEP 12/16: compliant    Time  2    Period  Weeks    Status  Achieved      PT SHORT TERM GOAL #2   Title  Patient will perform sit to stand without UE suport to  demonstrate improved LE strength and stability     Baseline  11/14: require BUE support 12/16: requires BUE support 1/14: BUE support  2/3: SUE support     Time  2    Period  Weeks    Status  On-going    Target Date  04/28/18      PT SHORT TERM GOAL #3   Title  Patient will increase R knee AROM to 105 degrees flexion and -5 degrees extension for improved mobility and gait mechanics    Baseline  11/14: AROM: 95 flexion extension -10. 12/16: flexion 120, extension -3     Time  2    Period  Weeks    Status  Achieved      PT SHORT TERM GOAL #4   Title  Patient will increase R knee strength to 3/5 as to improve functional strength for independent gait, increased standing tolerance and increased ADL ability.    Baseline  11/14: 2+/5 12/16: 4-/5     Time  2    Period  Weeks    Status  Achieved        PT Long Term Goals - 03/25/18 1526      PT LONG TERM GOAL #1   Title  Patient will increase R LE strength to 4+/5 as to improve functional strength for independent gait, increased standing tolerance and increased ADL ability.    Baseline  11/14: R knee 2+/5 12/16: 4-/5  1/14: R hip 4/5 Knee 4-/5     Time  4    Period  Weeks    Status  Partially Met    Target Date  04/22/18      PT LONG TERM GOAL #2   Title  Patient will report a worst pain of 3/10 on VAS in  R knee  to improve tolerance with ADLs and reduced symptoms with activities.     Baseline  11/14: 7/10 worst pain  12/16: 5/10 1/14: 5/10     Time  4    Period  Weeks    Status  Partially Met    Target Date  04/22/18      PT LONG TERM GOAL #3   Title  Patient will increase R knee AROM to 120 degrees flexion and neutral extension for return to PLOF and natural ambulatory mechanics.     Baseline  11/14: R knee 95 flexion -10 extension 12/16: 120 flexion, -3 extension after stretch 1/14: >120 flexion, neutral extensions     Time  4    Period  Weeks    Status  Achieved      PT LONG TERM GOAL #4   Title  Patient (> 80 years old)  will complete five times sit to stand test in < 15 seconds indicating an increased LE strength and improved balance.    Baseline  11/14: 22 seconds with excessive UE support and R knee forward 12/16: 14 seconds with BUE support 1/14: 13 seconds seconds BUE suppor     Time  4    Period  Weeks    Status  Partially Met    Target Date  04/22/18      PT LONG TERM GOAL #5   Title  Patient will increase lower extremity functional scale to >60/80 to demonstrate improved functional mobility and increased tolerance with ADLs.     Baseline  11/14: 40/80 12/16 : 35/80 1/14: 47/80     Time  4    Period  Weeks    Status  Partially Met    Target Date  04/22/18      PT LONG TERM GOAL #6   Title  Patient will increase 10 meter walk test to >1.20ms with least restrictive device to improve gait speed for better community ambulation and to reduce fall risk.    Baseline  11/14: .71 m/s with cane 12/16: .83 m/s without cane 1/14: .91 m/s without cane    Time  4    Period  Weeks    Status  Partially Met    Target Date  04/22/18      PT LONG TERM GOAL #7   Title  Patient will increase six minute walk test distance to >1000 for progression to community ambulator and improve gait ability    Baseline  12/16: 798 ft with and without cane 1/14: 812 ft without cane    Time  4    Period  Weeks    Status  Partially Met    Target Date  04/22/18            Plan - 04/14/18 1747    Clinical Impression Statement  Patient progressing towards functional goals at this time with decreased reliance upon assistance device for mobility and improved velocity and biomechanics of mobility. Patient still limited in ability to transfer without at least one UE support. Patient's condition has the potential to improve in response to therapy. Maximum improvement is yet to be obtained. The anticipated improvement is attainable and reasonable in a generally predictable time.  Patient will continue to benefit from skilled  therapeutic intervention to address deficits in strength, mobility, balance and activity tolerance in order to return to her PLOF    Rehab Potential  Good    Clinical Impairments Affecting Rehab Potential  (+) family support, prior level of function, high pain tolerance (-) age    PT Frequency  2x / week    PT Duration  4 weeks    PT Treatment/Interventions  ADLs/Self Care Home Management;Aquatic Therapy;Cryotherapy;Electrical Stimulation;Iontophoresis 44mml Dexamethasone;Moist Heat;Traction;Ultrasound;DME Instruction;Balance training;Therapeutic exercise;Therapeutic activities;Functional mobility training;Stair training;Gait training;Neuromuscular re-education;Patient/family education;Scar mobilization;Compression bandaging;Manual lymph drainage;Manual techniques;Passive range of motion;Dry needling;Energy conservation;Splinting;Taping    PT Next Visit Plan  eccentric control     PT Home Exercise Plan  continue HEP     Consulted and Agree with Plan of Care  Patient       Patient will benefit from skilled therapeutic intervention in order to improve the following deficits and impairments:  Abnormal gait, Decreased activity tolerance, Decreased balance, Decreased endurance, Decreased  coordination, Decreased mobility, Decreased range of motion, Decreased scar mobility, Decreased strength, Difficulty walking, Increased edema, Hypomobility, Impaired perceived functional ability, Impaired flexibility, Improper body mechanics, Postural dysfunction, Pain  Visit Diagnosis: Acute pain of right knee  Stiffness of right knee, not elsewhere classified  Other abnormalities of gait and mobility  Muscle weakness (generalized)     Problem List There are no active problems to display for this patient.  Janna Arch, PT, DPT   04/14/2018, 5:49 PM  [place] MAIN [facility] SERVICES [address] [place], [place], [address] Phone: [phone]   Fax:   [phone]  Name: JADINE BRUMLEY MRN: [id number] Date of Birth: 12-May-1933

## 2018-04-16 ENCOUNTER — Ambulatory Visit: Payer: Medicare Other

## 2018-04-16 DIAGNOSIS — M25661 Stiffness of right knee, not elsewhere classified: Secondary | ICD-10-CM

## 2018-04-16 DIAGNOSIS — M25561 Pain in right knee: Secondary | ICD-10-CM | POA: Diagnosis not present

## 2018-04-16 DIAGNOSIS — M6281 Muscle weakness (generalized): Secondary | ICD-10-CM

## 2018-04-16 DIAGNOSIS — R2689 Other abnormalities of gait and mobility: Secondary | ICD-10-CM

## 2018-04-16 NOTE — Therapy (Signed)
[facility] SERVICES [address] [place], [place], [address] Phone: [phone]   Fax:  [phone]  Physical Therapy Treatment  Patient Details  Name: Sarah Friedman MRN: [id number] Date of Birth: 03/21/1933 Referring Provider (PT): Jon Hendershot , [place]    Encounter Date: 04/16/2018  PT End of Session - 04/16/18 1352    Visit Number  19    Number of Visits  20    Date for PT Re-Evaluation  04/22/18    Authorization Type   1/10 starting 2/3    PT Start Time  1345    PT Stop Time  1429    PT Time Calculation (min)  44 min    Equipment Utilized During Treatment  Gait belt    Activity Tolerance  Patient tolerated treatment well    Behavior During Therapy  WFL for tasks assessed/performed       Past Medical History:  Diagnosis Date  . Arthritis   . Hypertension   . Vertigo     Past Surgical History:  Procedure Laterality Date  . CATARACT EXTRACTION W/PHACO Right 04/23/2017   Procedure: CATARACT EXTRACTION PHACO AND INTRAOCULAR LENS PLACEMENT (IOC);  Surgeon: Porfilio, William, MD;  Location: [facility] ORS;  Service: Ophthalmology;  Laterality: Right;  [place] 00:43.8 AP% 18.3 CDE 8.05 Fluid Pack lot # [id number]  . CATARACT EXTRACTION W/PHACO Left 05/14/2017   Procedure: CATARACT EXTRACTION PHACO AND INTRAOCULAR LENS PLACEMENT (IOC);  Surgeon: Porfilio, William, MD;  Location: [facility] ORS;  Service: Ophthalmology;  Laterality: Left;  US 00:29.5 AP% 17.2 CDE 5.07 Fluid Pack Lot # [id number]  . JOINT REPLACEMENT Left    TKR  . OVARIAN CYST SURGERY    . TONSILLECTOMY      There were no vitals filed for this visit.  Subjective Assessment - 04/16/18 1350    Subjective  Patient reports her knees have been sore and swollen feeling for the past two days with this morning worse.     Pertinent History  Patient is a pleasant 84 year old female who received a R TKA on 01/09/18. PMH includes HTN, HLD, anemia, osteopenia, right ventricular end  diastolic pressure elevation. Patient presents with son, using cane for the first time. Was not walking with an AD prior to surgery. Has a history of L TKA. No pain medication being taken, just Tylenol. Had two week of home health and her son helped her with her home health HEP. Wants to return to playing with grandchildren.     Currently in Pain?  Yes    Pain Score  4     Pain Location  Knee    Pain Orientation  Right;Left    Pain Descriptors / Indicators  Aching    Pain Type  Surgical pain    Pain Onset  More than a month ago    Pain Frequency  Intermittent       TREATMENT:      Assessment of R knee pain: tender to palpation and lengthening of hamstring with slight hematoma over lateral hamstring musculature (potentially biceps femoris). Noted edema at muscle insertion indicating possible strain to musculature. Due to patient's high pain level conservative pain management performed with education.    Supine: Hamstring stretch 4x60 seconds with gentle increase for reduction of pain in R lateral hamstring.  IT band stretch 2x60 seconds with varying planes of vertical pull for tension and muscle tissue lengthening  Patellar glides: medial, lateral, inferior, superior: 4x15 seconds each direction for   reduction of fluid and improved fluidity of movement.  Prone: STM to hamstring and lateral musculature with focus on biceps femoris) for reduction of pain and reduction of fluid.   Seated:  Ice to hamstring with education                          PT Education - 04/16/18 1351    Education Details  exercise technique, stability, stretching    Person(s) Educated  Patient    Methods  Explanation;Demonstration;Tactile cues;Verbal cues    Comprehension  Verbalized understanding;Returned demonstration;Verbal cues required;Tactile cues required;Need further instruction       PT Short Term Goals - 04/14/18 1748      PT SHORT TERM GOAL #1   Title  Patient will be  independent in home exercise program to improve strength/mobility for better functional independence with ADLs    Baseline  compliance with HH HEP 12/16: compliant    Time  2    Period  Weeks    Status  Achieved      PT SHORT TERM GOAL #2   Title  Patient will perform sit to stand without UE suport to demonstrate improved LE strength and stability     Baseline  11/14: require BUE support 12/16: requires BUE support 1/14: BUE support  2/3: SUE support     Time  2    Period  Weeks    Status  On-going    Target Date  04/28/18      PT SHORT TERM GOAL #3   Title  Patient will increase R knee AROM to 105 degrees flexion and -5 degrees extension for improved mobility and gait mechanics    Baseline  11/14: AROM: 95 flexion extension -10. 12/16: flexion 120, extension -3     Time  2    Period  Weeks    Status  Achieved      PT SHORT TERM GOAL #4   Title  Patient will increase R knee strength to 3/5 as to improve functional strength for independent gait, increased standing tolerance and increased ADL ability.    Baseline  11/14: 2+/5 12/16: 4-/5     Time  2    Period  Weeks    Status  Achieved        PT Long Term Goals - 04/14/18 1748      PT LONG TERM GOAL #1   Title  Patient will increase R LE strength to 4+/5 as to improve functional strength for independent gait, increased standing tolerance and increased ADL ability.    Baseline  11/14: R knee 2+/5 12/16: 4-/5  1/14: R hip 4/5 Knee 4-/5     Time  4    Period  Weeks    Status  Partially Met    Target Date  04/22/18      PT LONG TERM GOAL #2   Title  Patient will report a worst pain of 3/10 on VAS in  R knee  to improve tolerance with ADLs and reduced symptoms with activities.     Baseline  11/14: 7/10 worst pain  12/16: 5/10 1/14: 5/10 2/3: 3/10     Time  4    Period  Weeks    Status  Achieved      PT LONG TERM GOAL #3   Title  Patient will increase R knee AROM to 120 degrees flexion and neutral extension for return to PLOF  and natural ambulatory mechanics.       Baseline  11/14: R knee 95 flexion -10 extension 12/16: 120 flexion, -3 extension after stretch 1/14: >120 flexion, neutral extensions     Time  4    Period  Weeks    Status  Achieved      PT LONG TERM GOAL #4   Title  Patient (> 69 years old) will complete five times sit to stand test in < 15 seconds indicating an increased LE strength and improved balance.    Baseline  11/14: 22 seconds with excessive UE support and R knee forward 12/16: 14 seconds with BUE support 1/14: 13 seconds seconds BUE support 2/3: 12 seconds SUE support     Time  4    Period  Weeks    Status  Partially Met    Target Date  04/22/18      PT LONG TERM GOAL #5   Title  Patient will increase lower extremity functional scale to >60/80 to demonstrate improved functional mobility and increased tolerance with ADLs.     Baseline  11/14: 40/80 12/16 : 35/80 1/14: 47/80 2/3: 51/80     Time  4    Period  Weeks    Status  Partially Met    Target Date  04/22/18      PT LONG TERM GOAL #6   Title  Patient will increase 10 meter walk test to >1.43ms with least restrictive device to improve gait speed for better community ambulation and to reduce fall risk.    Baseline  11/14: .71 m/s with cane 12/16: .83 m/s without cane 1/14: .91 m/s without cane2/3: 1.0 m/s without AD     Time  4    Period  Weeks    Status  Achieved      PT LONG TERM GOAL #7   Title  Patient will increase six minute walk test distance to >1000 for progression to community ambulator and improve gait ability    Baseline  12/16: 798 ft with and without cane 1/14: 812 ft without cane 2/3: 910 ft without AD     Time  4    Period  Weeks    Status  Partially Met    Target Date  04/22/18            Plan - 04/16/18 1746    Clinical Impression Statement   Assessment of R knee pain: tender to palpation and lengthening of hamstring with slight hematoma over lateral hamstring musculature (potentially biceps femoris).  Noted edema at muscle insertion indicating possible strain to musculature. Due to patient's high pain level conservative pain management performed with education.  Patient will continue to benefit from skilled therapeutic intervention to address deficits in strength, mobility, balance and activity tolerance in order to return to her PLOF    Rehab Potential  Good    Clinical Impairments Affecting Rehab Potential  (+) family support, prior level of function, high pain tolerance (-) age    PT Frequency  2x / week    PT Duration  4 weeks    PT Treatment/Interventions  ADLs/Self Care Home Management;Aquatic Therapy;Cryotherapy;Electrical Stimulation;Iontophoresis 466mml Dexamethasone;Moist Heat;Traction;Ultrasound;DME Instruction;Balance training;Therapeutic exercise;Therapeutic activities;Functional mobility training;Stair training;Gait training;Neuromuscular re-education;Patient/family education;Scar mobilization;Compression bandaging;Manual lymph drainage;Manual techniques;Passive range of motion;Dry needling;Energy conservation;Splinting;Taping    PT Next Visit Plan  eccentric control     PT Home Exercise Plan  continue HEP     Consulted and Agree with Plan of Care  Patient       Patient will benefit from skilled therapeutic  intervention in order to improve the following deficits and impairments:  Abnormal gait, Decreased activity tolerance, Decreased balance, Decreased endurance, Decreased coordination, Decreased mobility, Decreased range of motion, Decreased scar mobility, Decreased strength, Difficulty walking, Increased edema, Hypomobility, Impaired perceived functional ability, Impaired flexibility, Improper body mechanics, Postural dysfunction, Pain  Visit Diagnosis: Acute pain of right knee  Stiffness of right knee, not elsewhere classified  Other abnormalities of gait and mobility  Muscle weakness (generalized)     Problem List There are no active problems to display for this  patient.  Janna Arch, PT, DPT   04/16/2018, [place] [facility] [address] [place], [place], [address] Phone: [phone]   Fax:  [phone]  Name: Sarah Friedman MRN: [id number] Date of Birth: [birth year]

## 2018-04-21 ENCOUNTER — Ambulatory Visit: Payer: Medicare Other

## 2018-04-23 ENCOUNTER — Ambulatory Visit: Payer: Medicare Other

## 2018-04-23 DIAGNOSIS — M25661 Stiffness of right knee, not elsewhere classified: Secondary | ICD-10-CM

## 2018-04-23 DIAGNOSIS — M25561 Pain in right knee: Secondary | ICD-10-CM

## 2018-04-23 DIAGNOSIS — M6281 Muscle weakness (generalized): Secondary | ICD-10-CM

## 2018-04-23 DIAGNOSIS — R2689 Other abnormalities of gait and mobility: Secondary | ICD-10-CM

## 2018-04-23 NOTE — Therapy (Signed)
[place] [facility] SERVICES [address] [place], [place], [address] Phone: [phone]   Fax:  [phone]  Physical Therapy Treatment/ [facility]ERT  Patient Details  Name: Sarah Friedman MRN: [id number] Date of Birth: 09/11/1933 Referring Provider (PT): Priscille Kluver , [place]    Encounter Date: 04/23/2018  PT End of Session - 04/23/18 1619    Visit Number  20    Number of Visits  28    Date for PT Re-Evaluation  05/21/18    Authorization Type   2/10 starting 2/3    PT Start Time  8850    PT Stop Time  1430    PT Time Calculation (min)  45 min    Equipment Utilized During Treatment  Gait belt    Activity Tolerance  Patient tolerated treatment well    Behavior During Therapy  WFL for tasks assessed/performed       Past Medical History:  Diagnosis Date  . Arthritis   . Hypertension   . Vertigo     Past Surgical History:  Procedure Laterality Date  . CATARACT EXTRACTION W/PHACO Right 04/23/2017   Procedure: CATARACT EXTRACTION PHACO AND INTRAOCULAR LENS PLACEMENT (IOC);  Surgeon: Sarah Robson, MD;  Location: [facility] ORS;  Service: Ophthalmology;  Laterality: Right;  [place] 00:43.8 AP% 18.3 CDE 8.05 Fluid Pack lot # [id number]  . CATARACT EXTRACTION W/PHACO Left 05/14/2017   Procedure: CATARACT EXTRACTION PHACO AND INTRAOCULAR LENS PLACEMENT (IOC);  Surgeon: Sarah Robson, MD;  Location: [facility] ORS;  Service: Ophthalmology;  Laterality: Left;  [place] 00:29.5 AP% 17.2 CDE 5.07 Fluid Pack Lot # [id number]  . JOINT REPLACEMENT Left    TKR  . OVARIAN CYST SURGERY    . TONSILLECTOMY      There were no vitals filed for this visit.  Subjective Assessment - 04/23/18 1605    Subjective  Patient reports the swelling and bruising has come down dramatically in back of leg. Is having some soreness in lateral aspect of knee and still feeling very stiff.     Pertinent History  Patient is a pleasant 85 year old female who received a R TKA on 01/09/18. PMH  includes HTN, HLD, anemia, osteopenia, right ventricular end diastolic pressure elevation. Patient presents with son, using cane for the first time. Was not walking with an AD prior to surgery. Has a history of L TKA. No pain medication being taken, just Tylenol. Had two week of home health and her son helped her with her home health HEP. Wants to return to playing with grandchildren.     Currently in Pain?  Yes    Pain Score  3     Pain Location  Knee    Pain Orientation  Right    Pain Descriptors / Indicators  Aching    Pain Type  Surgical pain    Pain Onset  More than a month ago    Pain Frequency  Intermittent      supine:  Hamstring stretch 60 seconds IT band stretch 60 seconds RLE long arc 60 seconds short arc  Sit to stands from plinth table, RTB around knees passing ball at peak of each stand, able to perform 80% of trials without UE support 3-4 minutes.  Sit to stands from standard height chair with dynadisc on seat to provide dynamic surface simulating chair at home. Stand up to shoot basketball and sit back down 15 trials with 8 performed without UE support.   Ambulate without AD 40 ft  with decreased weight shift onto RLE noted initially. x2 trials, second trial with improved weight shift.   Side step up stairs with BUE support and CGA with right leading ascending, left leading descending for maximal strain on right for strengthening/stability.   On bottom step: BUE support tapping letter of sticky note on floor with LLE for R eccentric control and stability. CGA, challenging to RLE with noted fatigue with repetition  Side step up 4" step: unable to perform without RLE pain.  Side step up green 3" step with no pain tossing ball to SPT with CGA from PT x 10 to right.    seated: Long arc quad kicking large swiss ball for coordination and muscle contraction x 10  Quick feet toe taps on 4" step with alternating pattern of movement x 30 seconds with good rhythm upon repetition.                         PT Education - 04/23/18 1619    Education Details  exercise technique, stability, POC, stretching    Person(s) Educated  Patient    Methods  Explanation;Demonstration;Tactile cues;Verbal cues    Comprehension  Verbalized understanding;Returned demonstration;Tactile cues required;Need further instruction;Verbal cues required       PT Short Term Goals - 04/23/18 1622      PT SHORT TERM GOAL #1   Title  Patient will be independent in home exercise program to improve strength/mobility for better functional independence with ADLs    Baseline  compliance with HH HEP 12/16: compliant    Time  2    Period  Weeks    Status  Achieved      PT SHORT TERM GOAL #2   Title  Patient will perform sit to stand without UE suport to demonstrate improved LE strength and stability     Baseline  11/14: require BUE support 12/16: requires BUE support 1/14: BUE support  2/3: SUE support     Time  2    Period  Weeks    Status  On-going    Target Date  04/28/18      PT SHORT TERM GOAL #3   Title  Patient will increase R knee AROM to 105 degrees flexion and -5 degrees extension for improved mobility and gait mechanics    Baseline  11/14: AROM: 95 flexion extension -10. 12/16: flexion 120, extension -3     Time  2    Period  Weeks    Status  Achieved      PT SHORT TERM GOAL #4   Title  Patient will increase R knee strength to 3/5 as to improve functional strength for independent gait, increased standing tolerance and increased ADL ability.    Baseline  11/14: 2+/5 12/16: 4-/5     Time  2    Period  Weeks    Status  Achieved        PT Long Term Goals - 04/23/18 1622      PT LONG TERM GOAL #1   Title  Patient will increase R LE strength to 4+/5 as to improve functional strength for independent gait, increased standing tolerance and increased ADL ability.    Baseline  11/14: R knee 2+/5 12/16: 4-/5  1/14: R hip 4/5 Knee 4-/5 2/12; 4/5 strength gross      Time  4    Period  Weeks    Status  Partially Met    Target Date  05/21/18  PT LONG TERM GOAL #2   Title  Patient will report a worst pain of 3/10 on VAS in  R knee  to improve tolerance with ADLs and reduced symptoms with activities.     Baseline  11/14: 7/10 worst pain  12/16: 5/10 1/14: 5/10 2/3: 3/10     Time  4    Period  Weeks    Status  Achieved      PT LONG TERM GOAL #3   Title  Patient will increase R knee AROM to 120 degrees flexion and neutral extension for return to PLOF and natural ambulatory mechanics.     Baseline  11/14: R knee 95 flexion -10 extension 12/16: 120 flexion, -3 extension after stretch 1/14: >120 flexion, neutral extensions     Time  4    Period  Weeks    Status  Achieved      PT LONG TERM GOAL #4   Title  Patient (> 61 years old) will complete five times sit to stand test in < 15 seconds indicating an increased LE strength and improved balance.    Baseline  11/14: 22 seconds with excessive UE support and R knee forward 12/16: 14 seconds with BUE support 1/14: 13 seconds seconds BUE support 2/3: 12 seconds SUE support     Time  4    Period  Weeks    Status  Partially Met      PT LONG TERM GOAL #5   Title  Patient will increase lower extremity functional scale to >60/80 to demonstrate improved functional mobility and increased tolerance with ADLs.     Baseline  11/14: 40/80 12/16 : 35/80 1/14: 47/80 2/3: 51/80     Time  4    Period  Weeks    Status  Partially Met      PT LONG TERM GOAL #6   Title  Patient will increase 10 meter walk test to >1.53ms with least restrictive device to improve gait speed for better community ambulation and to reduce fall risk.    Baseline  11/14: .71 m/s with cane 12/16: .83 m/s without cane 1/14: .91 m/s without cane2/3: 1.0 m/s without AD     Time  4    Period  Weeks    Status  Achieved      PT LONG TERM GOAL #7   Title  Patient will increase six minute walk test distance to >1000 for progression to community  ambulator and improve gait ability    Baseline  12/16: 798 ft with and without cane 1/14: 812 ft without cane 2/3: 910 ft without AD     Time  4    Period  Weeks    Status  Partially Met            Plan - 04/23/18 1621    Clinical Impression Statement  Patient presents with decreased pain from previous session allowing return to higher level dynamic activities for strengthening and stability. Goals had been re-assessed on 04/14/18 and can be seen in that note. Patient has demonstrated increased mobility and progress towards functional goals at this time however continues to be limited with single limb stability and prolonged capacity for mobility. Recent recovery from hamstring strain has limited patient's mobility the past week. The anticipated improvement is attainable and reasonable in a generally predictable time. Patient will continue to benefit from skilled therapeutic intervention to address deficits in strength, mobility, balance and activity tolerance in order to return to her PLOF  Rehab Potential  Good    Clinical Impairments Affecting Rehab Potential  (+) family support, prior level of function, high pain tolerance (-) age    PT Frequency  2x / week    PT Duration  4 weeks    PT Treatment/Interventions  ADLs/Self Care Home Management;Aquatic Therapy;Cryotherapy;Electrical Stimulation;Iontophoresis 50m/ml Dexamethasone;Moist Heat;Traction;Ultrasound;DME Instruction;Balance training;Therapeutic exercise;Therapeutic activities;Functional mobility training;Stair training;Gait training;Neuromuscular re-education;Patient/family education;Scar mobilization;Compression bandaging;Manual lymph drainage;Manual techniques;Passive range of motion;Dry needling;Energy conservation;Splinting;Taping    PT Next Visit Plan  eccentric control     PT Home Exercise Plan  continue HEP     Consulted and Agree with Plan of Care  Patient       Patient will benefit from skilled therapeutic intervention in  order to improve the following deficits and impairments:  Abnormal gait, Decreased activity tolerance, Decreased balance, Decreased endurance, Decreased coordination, Decreased mobility, Decreased range of motion, Decreased scar mobility, Decreased strength, Difficulty walking, Increased edema, Hypomobility, Impaired perceived functional ability, Impaired flexibility, Improper body mechanics, Postural dysfunction, Pain  Visit Diagnosis: Acute pain of right knee  Stiffness of right knee, not elsewhere classified  Other abnormalities of gait and mobility  Muscle weakness (generalized)     Problem List There are no active problems to display for this patient.  MJanna Arch PT, DPT   04/23/2018, 4:24 PM  [place] [address] [address]Phone: [phone]  Fax:  [phone] Name: PJASKIRAN PATAMRN: [id number] of Birth: 112-03-1933

## 2018-05-01 ENCOUNTER — Ambulatory Visit: Payer: Medicare Other

## 2018-05-01 DIAGNOSIS — M25561 Pain in right knee: Secondary | ICD-10-CM

## 2018-05-01 DIAGNOSIS — M6281 Muscle weakness (generalized): Secondary | ICD-10-CM

## 2018-05-01 DIAGNOSIS — M25661 Stiffness of right knee, not elsewhere classified: Secondary | ICD-10-CM

## 2018-05-01 DIAGNOSIS — R2689 Other abnormalities of gait and mobility: Secondary | ICD-10-CM

## 2018-05-01 NOTE — Therapy (Signed)
[place] MAIN [facility] SERVICES [address] [place], [place], [address] Phone: [phone]   Fax:  [phone]  Physical Therapy Treatment  Patient Details  Name: Sarah Friedman MRN: [id number] Date of Birth: 1933/07/20 Referring Provider (PT): Priscille Kluver , [place]    Encounter Date: 05/01/2018  PT End of Session - 05/01/18 1418    Visit Number  21    Number of Visits  28    Date for PT Re-Evaluation  05/21/18    Authorization Type   3/10 starting 2/3    PT Start Time  6767    PT Stop Time  1515    PT Time Calculation (min)  44 min    Equipment Utilized During Treatment  Gait belt    Activity Tolerance  Patient tolerated treatment well    Behavior During Therapy  WFL for tasks assessed/performed       Past Medical History:  Diagnosis Date  . Arthritis   . Hypertension   . Vertigo     Past Surgical History:  Procedure Laterality Date  . CATARACT EXTRACTION W/PHACO Right 04/23/2017   Procedure: CATARACT EXTRACTION PHACO AND INTRAOCULAR LENS PLACEMENT (IOC);  Surgeon: Birder Robson, MD;  Location: [facility] ORS;  Service: Ophthalmology;  Laterality: Right;  [place] 00:43.8 AP% 18.3 CDE 8.05 Fluid Pack lot # [id number]  . CATARACT EXTRACTION W/PHACO Left 05/14/2017   Procedure: CATARACT EXTRACTION PHACO AND INTRAOCULAR LENS PLACEMENT (IOC);  Surgeon: Birder Robson, MD;  Location: [facility] ORS;  Service: Ophthalmology;  Laterality: Left;  [place] 00:29.5 AP% 17.2 CDE 5.07 Fluid Pack Lot # [id number]  . JOINT REPLACEMENT Left    TKR  . OVARIAN CYST SURGERY    . TONSILLECTOMY      There were no vitals filed for this visit.  Subjective Assessment - 05/01/18 1436    Subjective  Patient reports she woke up with the worst soreness and stiffness she has felt in a while due to the weather. Reports she only is using her cane at home when she goes up and down the stairs.     Pertinent History  Patient is a pleasant 85 year old female who received a R  TKA on 01/09/18. PMH includes HTN, HLD, anemia, osteopenia, right ventricular end diastolic pressure elevation. Patient presents with son, using cane for the first time. Was not walking with an AD prior to surgery. Has a history of L TKA. No pain medication being taken, just Tylenol. Had two week of home health and her son helped her with her home health HEP. Wants to return to playing with grandchildren.     Currently in Pain?  Yes    Pain Score  3     Pain Location  Knee    Pain Orientation  Right;Left    Pain Descriptors / Indicators  Sore    Pain Type  Surgical pain    Pain Onset  More than a month ago    Pain Frequency  Intermittent       supine:  Hamstring stretch 60 seconds RLE with overpressure at ankle for dorsiflexion IT band stretch 60 seconds RLE long arc 60 seconds short arc  Stair negotiation with CGA 25 steps with single UE support.  Verbal cueing for "up with the good (LLE), down with the bad (RLE)". CGA and min-mod verbal cueing. X 2 trials.    Sit to stands from standard height chair with dynadisc on seat to provide dynamic surface simulating chair at home.  8x   Ambulate without AD 310 ft with decreased weight shift onto RLE noted initially however equal weight shift improved within 80 ft.    Airex pad: reaching inside and outside BOS for return x 3 minutes.    Step over orange hurdle SUE support 12x each LE , CGA cueing for increasing hip and knee flexion for clearance   seated #2 ankle weights   LAQ 10x each LE, verbal cueing for slowing velocity  Standing marches 20x in //bar BUS support   heel rocker dorsiflexion stretch 2x 30 seconds each LE                            PT Education - 05/01/18 1418    Education Details  exercise technique, stability, POC     Person(s) Educated  Patient    Methods  Explanation;Demonstration;Tactile cues;Verbal cues    Comprehension  Verbalized understanding;Returned demonstration;Verbal cues  required;Tactile cues required       PT Short Term Goals - 04/23/18 1622      PT SHORT TERM GOAL #1   Title  Patient will be independent in home exercise program to improve strength/mobility for better functional independence with ADLs    Baseline  compliance with HH HEP 12/16: compliant    Time  2    Period  Weeks    Status  Achieved      PT SHORT TERM GOAL #2   Title  Patient will perform sit to stand without UE suport to demonstrate improved LE strength and stability     Baseline  11/14: require BUE support 12/16: requires BUE support 1/14: BUE support  2/3: SUE support     Time  2    Period  Weeks    Status  On-going    Target Date  04/28/18      PT SHORT TERM GOAL #3   Title  Patient will increase R knee AROM to 105 degrees flexion and -5 degrees extension for improved mobility and gait mechanics    Baseline  11/14: AROM: 95 flexion extension -10. 12/16: flexion 120, extension -3     Time  2    Period  Weeks    Status  Achieved      PT SHORT TERM GOAL #4   Title  Patient will increase R knee strength to 3/5 as to improve functional strength for independent gait, increased standing tolerance and increased ADL ability.    Baseline  11/14: 2+/5 12/16: 4-/5     Time  2    Period  Weeks    Status  Achieved        PT Long Term Goals - 04/23/18 1622      PT LONG TERM GOAL #1   Title  Patient will increase R LE strength to 4+/5 as to improve functional strength for independent gait, increased standing tolerance and increased ADL ability.    Baseline  11/14: R knee 2+/5 12/16: 4-/5  1/14: R hip 4/5 Knee 4-/5 2/12; 4/5 strength gross     Time  4    Period  Weeks    Status  Partially Met    Target Date  05/21/18      PT LONG TERM GOAL #2   Title  Patient will report a worst pain of 3/10 on VAS in  R knee  to improve tolerance with ADLs and reduced symptoms with activities.     Baseline  11/14: 7/10  worst pain  12/16: 5/10 1/14: 5/10 2/3: 3/10     Time  4    Period  Weeks     Status  Achieved      PT LONG TERM GOAL #3   Title  Patient will increase R knee AROM to 120 degrees flexion and neutral extension for return to PLOF and natural ambulatory mechanics.     Baseline  11/14: R knee 95 flexion -10 extension 12/16: 120 flexion, -3 extension after stretch 1/14: >120 flexion, neutral extensions     Time  4    Period  Weeks    Status  Achieved      PT LONG TERM GOAL #4   Title  Patient (> 18 years old) will complete five times sit to stand test in < 15 seconds indicating an increased LE strength and improved balance.    Baseline  11/14: 22 seconds with excessive UE support and R knee forward 12/16: 14 seconds with BUE support 1/14: 13 seconds seconds BUE support 2/3: 12 seconds SUE support     Time  4    Period  Weeks    Status  Partially Met      PT LONG TERM GOAL #5   Title  Patient will increase lower extremity functional scale to >60/80 to demonstrate improved functional mobility and increased tolerance with ADLs.     Baseline  11/14: 40/80 12/16 : 35/80 1/14: 47/80 2/3: 51/80     Time  4    Period  Weeks    Status  Partially Met      PT LONG TERM GOAL #6   Title  Patient will increase 10 meter walk test to >1.1ms with least restrictive device to improve gait speed for better community ambulation and to reduce fall risk.    Baseline  11/14: .71 m/s with cane 12/16: .83 m/s without cane 1/14: .91 m/s without cane2/3: 1.0 m/s without AD     Time  4    Period  Weeks    Status  Achieved      PT LONG TERM GOAL #7   Title  Patient will increase six minute walk test distance to >1000 for progression to community ambulator and improve gait ability    Baseline  12/16: 798 ft with and without cane 1/14: 812 ft without cane 2/3: 910 ft without AD     Time  4    Period  Weeks    Status  Partially Met            Plan - 05/01/18 1539    Clinical Impression Statement  Patient presents initially with increased stiffness and soreness however after  prolonged interventions patient had improved movement and stability. Increased flexion of knee and hip performed with no pain. Stair negotiation performed with step to pattern and CGA with no increase in pain for performance at home. Patient aware that next session is her last session.  Patient will continue to benefit from skilled therapeutic intervention to address deficits in strength, mobility, balance and activity tolerance in order to return to her PLOF    Rehab Potential  Good    Clinical Impairments Affecting Rehab Potential  (+) family support, prior level of function, high pain tolerance (-) age    PT Frequency  2x / week    PT Duration  4 weeks    PT Treatment/Interventions  ADLs/Self Care Home Management;Aquatic Therapy;Cryotherapy;Electrical Stimulation;Iontophoresis 427mml Dexamethasone;Moist Heat;Traction;Ultrasound;DME Instruction;Balance training;Therapeutic exercise;Therapeutic activities;Functional mobility training;Stair training;Gait training;Neuromuscular re-education;Patient/family  education;Scar mobilization;Compression bandaging;Manual lymph drainage;Manual techniques;Passive range of motion;Dry needling;Energy conservation;Splinting;Taping    PT Next Visit Plan  eccentric control     PT Home Exercise Plan  continue HEP     Consulted and Agree with Plan of Care  Patient       Patient will benefit from skilled therapeutic intervention in order to improve the following deficits and impairments:  Abnormal gait, Decreased activity tolerance, Decreased balance, Decreased endurance, Decreased coordination, Decreased mobility, Decreased range of motion, Decreased scar mobility, Decreased strength, Difficulty walking, Increased edema, Hypomobility, Impaired perceived functional ability, Impaired flexibility, Improper body mechanics, Postural dysfunction, Pain  Visit Diagnosis: Acute pain of right knee  Stiffness of right knee, not elsewhere classified  Other abnormalities of gait  and mobility  Muscle weakness (generalized)     Problem List There are no active problems to display for this patient.  Janna Arch, PT, DPT   05/01/2018, 3:40 PM  [place] MAIN [facility] SERVICES [address] [place], [place], [address] Phone: [phone]   Fax:  [phone]  Name: MISCHA BRITTINGHAM MRN: [id number] Date of Birth: 1933/12/16

## 2018-05-05 ENCOUNTER — Ambulatory Visit: Payer: Medicare Other

## 2018-05-05 DIAGNOSIS — R2689 Other abnormalities of gait and mobility: Secondary | ICD-10-CM

## 2018-05-05 DIAGNOSIS — M25561 Pain in right knee: Secondary | ICD-10-CM | POA: Diagnosis not present

## 2018-05-05 DIAGNOSIS — M6281 Muscle weakness (generalized): Secondary | ICD-10-CM

## 2018-05-05 DIAGNOSIS — M25661 Stiffness of right knee, not elsewhere classified: Secondary | ICD-10-CM

## 2018-05-05 NOTE — Therapy (Signed)
[place] MAIN [facility] SERVICES [address] [place], [place], [address] Phone: [phone]   Fax:  [phone]  Physical Therapy Treatment/Discharge   Patient Details  Name: Sarah Friedman MRN: [id number] Date of Birth: 01-14-1934 Referring Provider (PT): Priscille Kluver , [place]    Encounter Date: 05/05/2018  PT End of Session - 05/05/18 1522    Visit Number  22    Number of Visits  28    Date for PT Re-Evaluation  05/21/18    Authorization Type   4/10 starting 2/3    PT Start Time  1515    PT Stop Time  1601    PT Time Calculation (min)  46 min    Equipment Utilized During Treatment  Gait belt    Activity Tolerance  Patient tolerated treatment well    Behavior During Therapy  WFL for tasks assessed/performed       Past Medical History:  Diagnosis Date  . Arthritis   . Hypertension   . Vertigo     Past Surgical History:  Procedure Laterality Date  . CATARACT EXTRACTION W/PHACO Right 04/23/2017   Procedure: CATARACT EXTRACTION PHACO AND INTRAOCULAR LENS PLACEMENT (IOC);  Surgeon: Birder Robson, MD;  Location: [facility] ORS;  Service: Ophthalmology;  Laterality: Right;  [place] 00:43.8 AP% 18.3 CDE 8.05 Fluid Pack lot # [id number] H  . CATARACT EXTRACTION W/PHACO Left 05/14/2017   Procedure: CATARACT EXTRACTION PHACO AND INTRAOCULAR LENS PLACEMENT (IOC);  Surgeon: Birder Robson, MD;  Location: [facility] ORS;  Service: Ophthalmology;  Laterality: Left;  [place] 00:29.5 AP% 17.2 CDE 5.07 Fluid Pack Lot # [id number] H  . JOINT REPLACEMENT Left    TKR  . OVARIAN CYST SURGERY    . TONSILLECTOMY      There were no vitals filed for this visit.  Subjective Assessment - 05/05/18 1520    Subjective  Patient reports she is feeling stiff due to having a new bed thats adjustable and her husband has it raised up. Is aware that today is her last session and doesn't have any questions or concerns.     Pertinent History  Patient is a pleasant 85 year old female  who received a R TKA on 01/09/18. PMH includes HTN, HLD, anemia, osteopenia, right ventricular end diastolic pressure elevation. Patient presents with son, using cane for the first time. Was not walking with an AD prior to surgery. Has a history of L TKA. No pain medication being taken, just Tylenol. Had two week of home health and her son helped her with her home health HEP. Wants to return to playing with grandchildren.     Currently in Pain?  No/denies        TREATMENT:   Nustep Level 3 4 minutes with RPM>60 for knee flexion and extension support with increasing capacity for prolonged mobilization   supine:  Hamstring stretch 60 seconds RLE with overpressure at ankle for dorsiflexion IT band stretch 60 seconds RLE long arc 60 seconds short arc Supine hip flexor stretch 2x 30 seconds Ice cup massage for soreness relief of lateral inferior aspect of R knee 2 minutes. Education on how to perform at home.   Final Knee flexion measurement: 130 degrees, extension neutral.    New HEP created for continued progression   Access Code: [email or web address]  URL: [email or web address]  Date: 05/05/2018  Prepared by: Janna Arch   Exercises  Standing March with Counter Support - 10 reps - 2 sets - 5 hold - 1x daily - 7x weekly  Standing Hip Abduction with Counter Support - 10 reps - 2 sets - 5 hold - 1x daily - 7x weekly  Standing Hip Extension with Counter Support - 10 reps - 2 sets - 5 hold - 1x daily - 7x weekly  Standing Knee Flexion - 10 reps - 2 sets - 5 hold - 1x daily - 7x weekly  Seated Hamstring Stretch - 2 reps - 2 sets - 60 hold - 1x daily - 7x weekly  Seated Long Arc Quad - 10 reps - 2 sets - 5 hold - 1x daily - 7x weekly  Sit to Stand - 10 reps - 2 sets - 5 hold - 1x daily - 7x weekly  Forward Step Up - 10 reps - 2 sets - 5 hold - 1x daily - 7x weekly       This is patient's final session and review of new HEP was performed. Patient demonstrated and verbalized  understanding of importance of compliance of HEP and correct body mechanics of the interventions. Patient has returned to full functional ROM of her knee and goals had been re-assessed on 04/14/18 indicating return to PLOF. I will be happy to see patient again as needed in the future.                   PT Education - 05/05/18 1522    Education Details  exercise technique, POC, discharge     Person(s) Educated  Patient    Methods  Explanation;Demonstration;Tactile cues;Verbal cues    Comprehension  Verbalized understanding;Returned demonstration;Verbal cues required;Tactile cues required       PT Short Term Goals - 04/23/18 1622      PT SHORT TERM GOAL #1   Title  Patient will be independent in home exercise program to improve strength/mobility for better functional independence with ADLs    Baseline  compliance with HH HEP 12/16: compliant    Time  2    Period  Weeks    Status  Achieved      PT SHORT TERM GOAL #2   Title  Patient will perform sit to stand without UE suport to demonstrate improved LE strength and stability     Baseline  11/14: require BUE support 12/16: requires BUE support 1/14: BUE support  2/3: SUE support     Time  2    Period  Weeks    Status  On-going    Target Date  04/28/18      PT SHORT TERM GOAL #3   Title  Patient will increase R knee AROM to 105 degrees flexion and -5 degrees extension for improved mobility and gait mechanics    Baseline  11/14: AROM: 95 flexion extension -10. 12/16: flexion 120, extension -3     Time  2    Period  Weeks    Status  Achieved      PT SHORT TERM GOAL #4   Title  Patient will increase R knee strength to 3/5 as to improve functional strength for independent gait, increased standing tolerance and increased ADL ability.    Baseline  11/14: 2+/5 12/16: 4-/5     Time  2    Period  Weeks    Status  Achieved        PT Long Term Goals - 04/23/18 1622      PT LONG TERM GOAL #1   Title  Patient will increase  R LE strength to 4+/5 as to improve functional strength for independent gait,  increased standing tolerance and increased ADL ability.    Baseline  11/14: R knee 2+/5 12/16: 4-/5  1/14: R hip 4/5 Knee 4-/5 2/12; 4/5 strength gross     Time  4    Period  Weeks    Status  Partially Met    Target Date  05/21/18      PT LONG TERM GOAL #2   Title  Patient will report a worst pain of 3/10 on VAS in  R knee  to improve tolerance with ADLs and reduced symptoms with activities.     Baseline  11/14: 7/10 worst pain  12/16: 5/10 1/14: 5/10 2/3: 3/10     Time  4    Period  Weeks    Status  Achieved      PT LONG TERM GOAL #3   Title  Patient will increase R knee AROM to 120 degrees flexion and neutral extension for return to PLOF and natural ambulatory mechanics.     Baseline  11/14: R knee 95 flexion -10 extension 12/16: 120 flexion, -3 extension after stretch 1/14: >120 flexion, neutral extensions     Time  4    Period  Weeks    Status  Achieved      PT LONG TERM GOAL #4   Title  Patient (> 76 years old) will complete five times sit to stand test in < 15 seconds indicating an increased LE strength and improved balance.    Baseline  11/14: 22 seconds with excessive UE support and R knee forward 12/16: 14 seconds with BUE support 1/14: 13 seconds seconds BUE support 2/3: 12 seconds SUE support     Time  4    Period  Weeks    Status  Partially Met      PT LONG TERM GOAL #5   Title  Patient will increase lower extremity functional scale to >60/80 to demonstrate improved functional mobility and increased tolerance with ADLs.     Baseline  11/14: 40/80 12/16 : 35/80 1/14: 47/80 2/3: 51/80     Time  4    Period  Weeks    Status  Partially Met      PT LONG TERM GOAL #6   Title  Patient will increase 10 meter walk test to >1.26ms with least restrictive device to improve gait speed for better community ambulation and to reduce fall risk.    Baseline  11/14: .71 m/s with cane 12/16: .83 m/s without  cane 1/14: .91 m/s without cane2/3: 1.0 m/s without AD     Time  4    Period  Weeks    Status  Achieved      PT LONG TERM GOAL #7   Title  Patient will increase six minute walk test distance to >1000 for progression to community ambulator and improve gait ability    Baseline  12/16: 798 ft with and without cane 1/14: 812 ft without cane 2/3: 910 ft without AD     Time  4    Period  Weeks    Status  Partially Met            Plan - 05/05/18 1621    Clinical Impression Statement  This is patient's final session and review of new HEP was performed. Patient demonstrated and verbalized understanding of importance of compliance of HEP and correct body mechanics of the interventions. Patient has returned to full functional ROM of her knee and goals had been re-assessed on 04/14/18 indicating return  to PLOF. I will be happy to see patient again as needed in the future.     Rehab Potential  Good    Clinical Impairments Affecting Rehab Potential  (+) family support, prior level of function, high pain tolerance (-) age    PT Frequency  2x / week    PT Duration  4 weeks    PT Treatment/Interventions  ADLs/Self Care Home Management;Aquatic Therapy;Cryotherapy;Electrical Stimulation;Iontophoresis 38m/ml Dexamethasone;Moist Heat;Traction;Ultrasound;DME Instruction;Balance training;Therapeutic exercise;Therapeutic activities;Functional mobility training;Stair training;Gait training;Neuromuscular re-education;Patient/family education;Scar mobilization;Compression bandaging;Manual lymph drainage;Manual techniques;Passive range of motion;Dry needling;Energy conservation;Splinting;Taping    PT Next Visit Plan  eccentric control     PT Home Exercise Plan  continue HEP     Consulted and Agree with Plan of Care  Patient       Patient will benefit from skilled therapeutic intervention in order to improve the following deficits and impairments:  Abnormal gait, Decreased activity tolerance, Decreased balance,  Decreased endurance, Decreased coordination, Decreased mobility, Decreased range of motion, Decreased scar mobility, Decreased strength, Difficulty walking, Increased edema, Hypomobility, Impaired perceived functional ability, Impaired flexibility, Improper body mechanics, Postural dysfunction, Pain  Visit Diagnosis: Acute pain of right knee  Stiffness of right knee, not elsewhere classified  Other abnormalities of gait and mobility  Muscle weakness (generalized)     Problem List There are no active problems to display for this patient.  MJanna Arch PT, DPT   05/05/2018, 4:22 PM  [place] [facility] [address] [address]Phone: [phone]  Fax:  [phone] Name: Sarah COULIBALYMRN: [id number] of Birth: 101-08-1933

## 2020-10-31 DIAGNOSIS — Z1331 Encounter for screening for depression: Secondary | ICD-10-CM | POA: Diagnosis not present

## 2020-10-31 DIAGNOSIS — G301 Alzheimer's disease with late onset: Secondary | ICD-10-CM | POA: Diagnosis not present

## 2020-10-31 DIAGNOSIS — Z79899 Other long term (current) drug therapy: Secondary | ICD-10-CM | POA: Diagnosis not present

## 2020-10-31 DIAGNOSIS — I10 Essential (primary) hypertension: Secondary | ICD-10-CM | POA: Diagnosis not present

## 2020-10-31 DIAGNOSIS — Z636 Dependent relative needing care at home: Secondary | ICD-10-CM | POA: Diagnosis not present

## 2020-10-31 DIAGNOSIS — F0280 Dementia in other diseases classified elsewhere without behavioral disturbance: Secondary | ICD-10-CM | POA: Diagnosis not present

## 2020-11-04 DIAGNOSIS — H353211 Exudative age-related macular degeneration, right eye, with active choroidal neovascularization: Secondary | ICD-10-CM | POA: Diagnosis not present

## 2020-11-09 DIAGNOSIS — E7800 Pure hypercholesterolemia, unspecified: Secondary | ICD-10-CM | POA: Diagnosis not present

## 2020-11-09 DIAGNOSIS — I071 Rheumatic tricuspid insufficiency: Secondary | ICD-10-CM | POA: Diagnosis not present

## 2020-11-09 DIAGNOSIS — R9430 Abnormal result of cardiovascular function study, unspecified: Secondary | ICD-10-CM | POA: Diagnosis not present

## 2020-11-09 DIAGNOSIS — I10 Essential (primary) hypertension: Secondary | ICD-10-CM | POA: Diagnosis not present

## 2021-01-26 DIAGNOSIS — I10 Essential (primary) hypertension: Secondary | ICD-10-CM | POA: Diagnosis not present

## 2021-01-26 DIAGNOSIS — Z0000 Encounter for general adult medical examination without abnormal findings: Secondary | ICD-10-CM | POA: Diagnosis not present

## 2021-01-26 DIAGNOSIS — E7800 Pure hypercholesterolemia, unspecified: Secondary | ICD-10-CM | POA: Diagnosis not present

## 2021-01-26 DIAGNOSIS — Z79899 Other long term (current) drug therapy: Secondary | ICD-10-CM | POA: Diagnosis not present

## 2021-02-17 DIAGNOSIS — H353211 Exudative age-related macular degeneration, right eye, with active choroidal neovascularization: Secondary | ICD-10-CM | POA: Diagnosis not present

## 2021-05-04 DIAGNOSIS — Z79899 Other long term (current) drug therapy: Secondary | ICD-10-CM | POA: Diagnosis not present

## 2021-05-04 DIAGNOSIS — F0280 Dementia in other diseases classified elsewhere without behavioral disturbance: Secondary | ICD-10-CM | POA: Diagnosis not present

## 2021-05-04 DIAGNOSIS — Z1331 Encounter for screening for depression: Secondary | ICD-10-CM | POA: Diagnosis not present

## 2021-05-04 DIAGNOSIS — G301 Alzheimer's disease with late onset: Secondary | ICD-10-CM | POA: Diagnosis not present

## 2021-05-04 DIAGNOSIS — R269 Unspecified abnormalities of gait and mobility: Secondary | ICD-10-CM | POA: Diagnosis not present

## 2021-05-19 DIAGNOSIS — H353211 Exudative age-related macular degeneration, right eye, with active choroidal neovascularization: Secondary | ICD-10-CM | POA: Diagnosis not present

## 2021-06-12 DIAGNOSIS — G309 Alzheimer's disease, unspecified: Secondary | ICD-10-CM | POA: Diagnosis not present

## 2021-06-12 DIAGNOSIS — I272 Pulmonary hypertension, unspecified: Secondary | ICD-10-CM | POA: Diagnosis not present

## 2021-06-12 DIAGNOSIS — R9430 Abnormal result of cardiovascular function study, unspecified: Secondary | ICD-10-CM | POA: Diagnosis not present

## 2021-06-12 DIAGNOSIS — I10 Essential (primary) hypertension: Secondary | ICD-10-CM | POA: Diagnosis not present

## 2021-07-26 DIAGNOSIS — Z0000 Encounter for general adult medical examination without abnormal findings: Secondary | ICD-10-CM | POA: Diagnosis not present

## 2021-07-26 DIAGNOSIS — Z79899 Other long term (current) drug therapy: Secondary | ICD-10-CM | POA: Diagnosis not present

## 2021-07-26 DIAGNOSIS — R8290 Unspecified abnormal findings in urine: Secondary | ICD-10-CM | POA: Diagnosis not present

## 2021-07-26 DIAGNOSIS — I10 Essential (primary) hypertension: Secondary | ICD-10-CM | POA: Diagnosis not present

## 2021-07-26 DIAGNOSIS — R739 Hyperglycemia, unspecified: Secondary | ICD-10-CM | POA: Diagnosis not present

## 2021-07-29 DIAGNOSIS — R269 Unspecified abnormalities of gait and mobility: Secondary | ICD-10-CM | POA: Diagnosis not present

## 2021-07-29 DIAGNOSIS — I272 Pulmonary hypertension, unspecified: Secondary | ICD-10-CM | POA: Diagnosis not present

## 2021-07-29 DIAGNOSIS — M1712 Unilateral primary osteoarthritis, left knee: Secondary | ICD-10-CM | POA: Diagnosis not present

## 2021-07-29 DIAGNOSIS — D649 Anemia, unspecified: Secondary | ICD-10-CM | POA: Diagnosis not present

## 2021-07-29 DIAGNOSIS — E785 Hyperlipidemia, unspecified: Secondary | ICD-10-CM | POA: Diagnosis not present

## 2021-07-29 DIAGNOSIS — F0280 Dementia in other diseases classified elsewhere without behavioral disturbance: Secondary | ICD-10-CM | POA: Diagnosis not present

## 2021-07-29 DIAGNOSIS — G309 Alzheimer's disease, unspecified: Secondary | ICD-10-CM | POA: Diagnosis not present

## 2021-07-29 DIAGNOSIS — Z96651 Presence of right artificial knee joint: Secondary | ICD-10-CM | POA: Diagnosis not present

## 2021-07-29 DIAGNOSIS — M8580 Other specified disorders of bone density and structure, unspecified site: Secondary | ICD-10-CM | POA: Diagnosis not present

## 2021-07-29 DIAGNOSIS — I10 Essential (primary) hypertension: Secondary | ICD-10-CM | POA: Diagnosis not present

## 2021-07-29 DIAGNOSIS — R739 Hyperglycemia, unspecified: Secondary | ICD-10-CM | POA: Diagnosis not present

## 2021-07-29 DIAGNOSIS — M810 Age-related osteoporosis without current pathological fracture: Secondary | ICD-10-CM | POA: Diagnosis not present

## 2021-08-28 DIAGNOSIS — M1712 Unilateral primary osteoarthritis, left knee: Secondary | ICD-10-CM | POA: Diagnosis not present

## 2021-08-28 DIAGNOSIS — D649 Anemia, unspecified: Secondary | ICD-10-CM | POA: Diagnosis not present

## 2021-08-28 DIAGNOSIS — R739 Hyperglycemia, unspecified: Secondary | ICD-10-CM | POA: Diagnosis not present

## 2021-08-28 DIAGNOSIS — I272 Pulmonary hypertension, unspecified: Secondary | ICD-10-CM | POA: Diagnosis not present

## 2021-08-28 DIAGNOSIS — F0280 Dementia in other diseases classified elsewhere without behavioral disturbance: Secondary | ICD-10-CM | POA: Diagnosis not present

## 2021-08-28 DIAGNOSIS — R269 Unspecified abnormalities of gait and mobility: Secondary | ICD-10-CM | POA: Diagnosis not present

## 2021-08-28 DIAGNOSIS — Z96651 Presence of right artificial knee joint: Secondary | ICD-10-CM | POA: Diagnosis not present

## 2021-08-28 DIAGNOSIS — M810 Age-related osteoporosis without current pathological fracture: Secondary | ICD-10-CM | POA: Diagnosis not present

## 2021-08-28 DIAGNOSIS — G309 Alzheimer's disease, unspecified: Secondary | ICD-10-CM | POA: Diagnosis not present

## 2021-08-28 DIAGNOSIS — M8580 Other specified disorders of bone density and structure, unspecified site: Secondary | ICD-10-CM | POA: Diagnosis not present

## 2021-08-28 DIAGNOSIS — I10 Essential (primary) hypertension: Secondary | ICD-10-CM | POA: Diagnosis not present

## 2021-08-28 DIAGNOSIS — E785 Hyperlipidemia, unspecified: Secondary | ICD-10-CM | POA: Diagnosis not present

## 2021-08-31 DIAGNOSIS — H353211 Exudative age-related macular degeneration, right eye, with active choroidal neovascularization: Secondary | ICD-10-CM | POA: Diagnosis not present

## 2021-09-25 DIAGNOSIS — L820 Inflamed seborrheic keratosis: Secondary | ICD-10-CM | POA: Diagnosis not present

## 2021-09-25 DIAGNOSIS — D2262 Melanocytic nevi of left upper limb, including shoulder: Secondary | ICD-10-CM | POA: Diagnosis not present

## 2021-09-25 DIAGNOSIS — D2272 Melanocytic nevi of left lower limb, including hip: Secondary | ICD-10-CM | POA: Diagnosis not present

## 2021-09-25 DIAGNOSIS — D2261 Melanocytic nevi of right upper limb, including shoulder: Secondary | ICD-10-CM | POA: Diagnosis not present

## 2021-09-25 DIAGNOSIS — D225 Melanocytic nevi of trunk: Secondary | ICD-10-CM | POA: Diagnosis not present

## 2021-12-07 DIAGNOSIS — H353211 Exudative age-related macular degeneration, right eye, with active choroidal neovascularization: Secondary | ICD-10-CM | POA: Diagnosis not present

## 2022-01-09 DIAGNOSIS — I10 Essential (primary) hypertension: Secondary | ICD-10-CM | POA: Diagnosis not present

## 2022-01-09 DIAGNOSIS — I071 Rheumatic tricuspid insufficiency: Secondary | ICD-10-CM | POA: Diagnosis not present

## 2022-01-09 DIAGNOSIS — I272 Pulmonary hypertension, unspecified: Secondary | ICD-10-CM | POA: Diagnosis not present

## 2022-01-09 DIAGNOSIS — E7800 Pure hypercholesterolemia, unspecified: Secondary | ICD-10-CM | POA: Diagnosis not present

## 2022-02-12 DIAGNOSIS — R7309 Other abnormal glucose: Secondary | ICD-10-CM | POA: Diagnosis not present

## 2022-02-12 DIAGNOSIS — Z0000 Encounter for general adult medical examination without abnormal findings: Secondary | ICD-10-CM | POA: Diagnosis not present

## 2022-02-12 DIAGNOSIS — R8290 Unspecified abnormal findings in urine: Secondary | ICD-10-CM | POA: Diagnosis not present

## 2022-02-12 DIAGNOSIS — I10 Essential (primary) hypertension: Secondary | ICD-10-CM | POA: Diagnosis not present

## 2022-02-12 DIAGNOSIS — Z79899 Other long term (current) drug therapy: Secondary | ICD-10-CM | POA: Diagnosis not present

## 2022-02-15 DIAGNOSIS — H353211 Exudative age-related macular degeneration, right eye, with active choroidal neovascularization: Secondary | ICD-10-CM | POA: Diagnosis not present

## 2022-02-16 DIAGNOSIS — M8580 Other specified disorders of bone density and structure, unspecified site: Secondary | ICD-10-CM | POA: Diagnosis not present

## 2022-02-16 DIAGNOSIS — Z79899 Other long term (current) drug therapy: Secondary | ICD-10-CM | POA: Diagnosis not present

## 2022-02-16 DIAGNOSIS — M810 Age-related osteoporosis without current pathological fracture: Secondary | ICD-10-CM | POA: Diagnosis not present

## 2022-02-16 DIAGNOSIS — I272 Pulmonary hypertension, unspecified: Secondary | ICD-10-CM | POA: Diagnosis not present

## 2022-02-16 DIAGNOSIS — E7800 Pure hypercholesterolemia, unspecified: Secondary | ICD-10-CM | POA: Diagnosis not present

## 2022-02-16 DIAGNOSIS — J3089 Other allergic rhinitis: Secondary | ICD-10-CM | POA: Diagnosis not present

## 2022-02-16 DIAGNOSIS — D649 Anemia, unspecified: Secondary | ICD-10-CM | POA: Diagnosis not present

## 2022-02-16 DIAGNOSIS — I10 Essential (primary) hypertension: Secondary | ICD-10-CM | POA: Diagnosis not present

## 2022-02-16 DIAGNOSIS — I071 Rheumatic tricuspid insufficiency: Secondary | ICD-10-CM | POA: Diagnosis not present

## 2022-02-16 DIAGNOSIS — G309 Alzheimer's disease, unspecified: Secondary | ICD-10-CM | POA: Diagnosis not present

## 2022-02-16 DIAGNOSIS — F0280 Dementia in other diseases classified elsewhere without behavioral disturbance: Secondary | ICD-10-CM | POA: Diagnosis not present

## 2022-02-16 DIAGNOSIS — Z96653 Presence of artificial knee joint, bilateral: Secondary | ICD-10-CM | POA: Diagnosis not present

## 2022-02-16 DIAGNOSIS — Z9181 History of falling: Secondary | ICD-10-CM | POA: Diagnosis not present

## 2022-02-16 DIAGNOSIS — R739 Hyperglycemia, unspecified: Secondary | ICD-10-CM | POA: Diagnosis not present

## 2022-03-18 DIAGNOSIS — I272 Pulmonary hypertension, unspecified: Secondary | ICD-10-CM | POA: Diagnosis not present

## 2022-03-18 DIAGNOSIS — D649 Anemia, unspecified: Secondary | ICD-10-CM | POA: Diagnosis not present

## 2022-03-18 DIAGNOSIS — G309 Alzheimer's disease, unspecified: Secondary | ICD-10-CM | POA: Diagnosis not present

## 2022-03-18 DIAGNOSIS — F0280 Dementia in other diseases classified elsewhere without behavioral disturbance: Secondary | ICD-10-CM | POA: Diagnosis not present

## 2022-03-18 DIAGNOSIS — M8580 Other specified disorders of bone density and structure, unspecified site: Secondary | ICD-10-CM | POA: Diagnosis not present

## 2022-03-18 DIAGNOSIS — I10 Essential (primary) hypertension: Secondary | ICD-10-CM | POA: Diagnosis not present

## 2022-03-18 DIAGNOSIS — Z96653 Presence of artificial knee joint, bilateral: Secondary | ICD-10-CM | POA: Diagnosis not present

## 2022-03-18 DIAGNOSIS — M810 Age-related osteoporosis without current pathological fracture: Secondary | ICD-10-CM | POA: Diagnosis not present

## 2022-03-18 DIAGNOSIS — I071 Rheumatic tricuspid insufficiency: Secondary | ICD-10-CM | POA: Diagnosis not present

## 2022-03-18 DIAGNOSIS — E7800 Pure hypercholesterolemia, unspecified: Secondary | ICD-10-CM | POA: Diagnosis not present

## 2022-03-18 DIAGNOSIS — Z79899 Other long term (current) drug therapy: Secondary | ICD-10-CM | POA: Diagnosis not present

## 2022-03-18 DIAGNOSIS — R739 Hyperglycemia, unspecified: Secondary | ICD-10-CM | POA: Diagnosis not present

## 2022-03-18 DIAGNOSIS — Z9181 History of falling: Secondary | ICD-10-CM | POA: Diagnosis not present

## 2022-03-18 DIAGNOSIS — J3089 Other allergic rhinitis: Secondary | ICD-10-CM | POA: Diagnosis not present

## 2022-03-19 DIAGNOSIS — F0280 Dementia in other diseases classified elsewhere without behavioral disturbance: Secondary | ICD-10-CM | POA: Diagnosis not present

## 2022-03-19 DIAGNOSIS — I272 Pulmonary hypertension, unspecified: Secondary | ICD-10-CM | POA: Diagnosis not present

## 2022-03-19 DIAGNOSIS — G309 Alzheimer's disease, unspecified: Secondary | ICD-10-CM | POA: Diagnosis not present

## 2022-03-19 DIAGNOSIS — I10 Essential (primary) hypertension: Secondary | ICD-10-CM | POA: Diagnosis not present

## 2022-03-21 DIAGNOSIS — M810 Age-related osteoporosis without current pathological fracture: Secondary | ICD-10-CM | POA: Diagnosis not present

## 2022-03-21 DIAGNOSIS — I10 Essential (primary) hypertension: Secondary | ICD-10-CM | POA: Diagnosis not present

## 2022-03-21 DIAGNOSIS — F0280 Dementia in other diseases classified elsewhere without behavioral disturbance: Secondary | ICD-10-CM | POA: Diagnosis not present

## 2022-03-21 DIAGNOSIS — Z79899 Other long term (current) drug therapy: Secondary | ICD-10-CM | POA: Diagnosis not present

## 2022-03-21 DIAGNOSIS — Z96653 Presence of artificial knee joint, bilateral: Secondary | ICD-10-CM | POA: Diagnosis not present

## 2022-03-21 DIAGNOSIS — R739 Hyperglycemia, unspecified: Secondary | ICD-10-CM | POA: Diagnosis not present

## 2022-03-21 DIAGNOSIS — I272 Pulmonary hypertension, unspecified: Secondary | ICD-10-CM | POA: Diagnosis not present

## 2022-03-21 DIAGNOSIS — D649 Anemia, unspecified: Secondary | ICD-10-CM | POA: Diagnosis not present

## 2022-03-21 DIAGNOSIS — I071 Rheumatic tricuspid insufficiency: Secondary | ICD-10-CM | POA: Diagnosis not present

## 2022-03-21 DIAGNOSIS — E7800 Pure hypercholesterolemia, unspecified: Secondary | ICD-10-CM | POA: Diagnosis not present

## 2022-03-21 DIAGNOSIS — G309 Alzheimer's disease, unspecified: Secondary | ICD-10-CM | POA: Diagnosis not present

## 2022-03-21 DIAGNOSIS — Z9181 History of falling: Secondary | ICD-10-CM | POA: Diagnosis not present

## 2022-03-21 DIAGNOSIS — M8580 Other specified disorders of bone density and structure, unspecified site: Secondary | ICD-10-CM | POA: Diagnosis not present

## 2022-03-21 DIAGNOSIS — J3089 Other allergic rhinitis: Secondary | ICD-10-CM | POA: Diagnosis not present

## 2022-03-28 DIAGNOSIS — G309 Alzheimer's disease, unspecified: Secondary | ICD-10-CM | POA: Diagnosis not present

## 2022-03-28 DIAGNOSIS — J3089 Other allergic rhinitis: Secondary | ICD-10-CM | POA: Diagnosis not present

## 2022-03-28 DIAGNOSIS — G301 Alzheimer's disease with late onset: Secondary | ICD-10-CM | POA: Diagnosis not present

## 2022-03-28 DIAGNOSIS — D649 Anemia, unspecified: Secondary | ICD-10-CM | POA: Diagnosis not present

## 2022-03-28 DIAGNOSIS — M8580 Other specified disorders of bone density and structure, unspecified site: Secondary | ICD-10-CM | POA: Diagnosis not present

## 2022-03-28 DIAGNOSIS — Z9181 History of falling: Secondary | ICD-10-CM | POA: Diagnosis not present

## 2022-03-28 DIAGNOSIS — I272 Pulmonary hypertension, unspecified: Secondary | ICD-10-CM | POA: Diagnosis not present

## 2022-03-28 DIAGNOSIS — I10 Essential (primary) hypertension: Secondary | ICD-10-CM | POA: Diagnosis not present

## 2022-03-28 DIAGNOSIS — F0280 Dementia in other diseases classified elsewhere without behavioral disturbance: Secondary | ICD-10-CM | POA: Diagnosis not present

## 2022-03-28 DIAGNOSIS — R739 Hyperglycemia, unspecified: Secondary | ICD-10-CM | POA: Diagnosis not present

## 2022-03-28 DIAGNOSIS — Z1331 Encounter for screening for depression: Secondary | ICD-10-CM | POA: Diagnosis not present

## 2022-03-28 DIAGNOSIS — M810 Age-related osteoporosis without current pathological fracture: Secondary | ICD-10-CM | POA: Diagnosis not present

## 2022-03-28 DIAGNOSIS — I071 Rheumatic tricuspid insufficiency: Secondary | ICD-10-CM | POA: Diagnosis not present

## 2022-03-28 DIAGNOSIS — Z79899 Other long term (current) drug therapy: Secondary | ICD-10-CM | POA: Diagnosis not present

## 2022-03-28 DIAGNOSIS — E7800 Pure hypercholesterolemia, unspecified: Secondary | ICD-10-CM | POA: Diagnosis not present

## 2022-03-28 DIAGNOSIS — Z96653 Presence of artificial knee joint, bilateral: Secondary | ICD-10-CM | POA: Diagnosis not present

## 2022-04-04 DIAGNOSIS — R739 Hyperglycemia, unspecified: Secondary | ICD-10-CM | POA: Diagnosis not present

## 2022-04-04 DIAGNOSIS — G309 Alzheimer's disease, unspecified: Secondary | ICD-10-CM | POA: Diagnosis not present

## 2022-04-04 DIAGNOSIS — Z79899 Other long term (current) drug therapy: Secondary | ICD-10-CM | POA: Diagnosis not present

## 2022-04-04 DIAGNOSIS — I272 Pulmonary hypertension, unspecified: Secondary | ICD-10-CM | POA: Diagnosis not present

## 2022-04-04 DIAGNOSIS — I10 Essential (primary) hypertension: Secondary | ICD-10-CM | POA: Diagnosis not present

## 2022-04-04 DIAGNOSIS — J3089 Other allergic rhinitis: Secondary | ICD-10-CM | POA: Diagnosis not present

## 2022-04-04 DIAGNOSIS — I071 Rheumatic tricuspid insufficiency: Secondary | ICD-10-CM | POA: Diagnosis not present

## 2022-04-04 DIAGNOSIS — M810 Age-related osteoporosis without current pathological fracture: Secondary | ICD-10-CM | POA: Diagnosis not present

## 2022-04-04 DIAGNOSIS — M8580 Other specified disorders of bone density and structure, unspecified site: Secondary | ICD-10-CM | POA: Diagnosis not present

## 2022-04-04 DIAGNOSIS — D649 Anemia, unspecified: Secondary | ICD-10-CM | POA: Diagnosis not present

## 2022-04-04 DIAGNOSIS — Z96653 Presence of artificial knee joint, bilateral: Secondary | ICD-10-CM | POA: Diagnosis not present

## 2022-04-04 DIAGNOSIS — F0280 Dementia in other diseases classified elsewhere without behavioral disturbance: Secondary | ICD-10-CM | POA: Diagnosis not present

## 2022-04-04 DIAGNOSIS — E7800 Pure hypercholesterolemia, unspecified: Secondary | ICD-10-CM | POA: Diagnosis not present

## 2022-04-04 DIAGNOSIS — Z9181 History of falling: Secondary | ICD-10-CM | POA: Diagnosis not present

## 2022-04-12 DIAGNOSIS — D649 Anemia, unspecified: Secondary | ICD-10-CM | POA: Diagnosis not present

## 2022-04-12 DIAGNOSIS — M8580 Other specified disorders of bone density and structure, unspecified site: Secondary | ICD-10-CM | POA: Diagnosis not present

## 2022-04-12 DIAGNOSIS — Z96653 Presence of artificial knee joint, bilateral: Secondary | ICD-10-CM | POA: Diagnosis not present

## 2022-04-12 DIAGNOSIS — I272 Pulmonary hypertension, unspecified: Secondary | ICD-10-CM | POA: Diagnosis not present

## 2022-04-12 DIAGNOSIS — G309 Alzheimer's disease, unspecified: Secondary | ICD-10-CM | POA: Diagnosis not present

## 2022-04-12 DIAGNOSIS — I071 Rheumatic tricuspid insufficiency: Secondary | ICD-10-CM | POA: Diagnosis not present

## 2022-04-12 DIAGNOSIS — M810 Age-related osteoporosis without current pathological fracture: Secondary | ICD-10-CM | POA: Diagnosis not present

## 2022-04-12 DIAGNOSIS — I10 Essential (primary) hypertension: Secondary | ICD-10-CM | POA: Diagnosis not present

## 2022-04-12 DIAGNOSIS — R739 Hyperglycemia, unspecified: Secondary | ICD-10-CM | POA: Diagnosis not present

## 2022-04-12 DIAGNOSIS — E7800 Pure hypercholesterolemia, unspecified: Secondary | ICD-10-CM | POA: Diagnosis not present

## 2022-04-12 DIAGNOSIS — F0280 Dementia in other diseases classified elsewhere without behavioral disturbance: Secondary | ICD-10-CM | POA: Diagnosis not present

## 2022-04-12 DIAGNOSIS — J3089 Other allergic rhinitis: Secondary | ICD-10-CM | POA: Diagnosis not present

## 2022-04-12 DIAGNOSIS — Z79899 Other long term (current) drug therapy: Secondary | ICD-10-CM | POA: Diagnosis not present

## 2022-04-12 DIAGNOSIS — Z9181 History of falling: Secondary | ICD-10-CM | POA: Diagnosis not present

## 2022-04-17 DIAGNOSIS — J3089 Other allergic rhinitis: Secondary | ICD-10-CM | POA: Diagnosis not present

## 2022-04-17 DIAGNOSIS — Z79899 Other long term (current) drug therapy: Secondary | ICD-10-CM | POA: Diagnosis not present

## 2022-04-17 DIAGNOSIS — I10 Essential (primary) hypertension: Secondary | ICD-10-CM | POA: Diagnosis not present

## 2022-04-17 DIAGNOSIS — R739 Hyperglycemia, unspecified: Secondary | ICD-10-CM | POA: Diagnosis not present

## 2022-04-17 DIAGNOSIS — I272 Pulmonary hypertension, unspecified: Secondary | ICD-10-CM | POA: Diagnosis not present

## 2022-04-17 DIAGNOSIS — F0280 Dementia in other diseases classified elsewhere without behavioral disturbance: Secondary | ICD-10-CM | POA: Diagnosis not present

## 2022-04-17 DIAGNOSIS — E7800 Pure hypercholesterolemia, unspecified: Secondary | ICD-10-CM | POA: Diagnosis not present

## 2022-04-17 DIAGNOSIS — M8580 Other specified disorders of bone density and structure, unspecified site: Secondary | ICD-10-CM | POA: Diagnosis not present

## 2022-04-17 DIAGNOSIS — M810 Age-related osteoporosis without current pathological fracture: Secondary | ICD-10-CM | POA: Diagnosis not present

## 2022-04-17 DIAGNOSIS — I071 Rheumatic tricuspid insufficiency: Secondary | ICD-10-CM | POA: Diagnosis not present

## 2022-04-17 DIAGNOSIS — Z96653 Presence of artificial knee joint, bilateral: Secondary | ICD-10-CM | POA: Diagnosis not present

## 2022-04-17 DIAGNOSIS — G309 Alzheimer's disease, unspecified: Secondary | ICD-10-CM | POA: Diagnosis not present

## 2022-04-17 DIAGNOSIS — Z9181 History of falling: Secondary | ICD-10-CM | POA: Diagnosis not present

## 2022-04-17 DIAGNOSIS — D649 Anemia, unspecified: Secondary | ICD-10-CM | POA: Diagnosis not present

## 2022-04-18 DIAGNOSIS — Z79899 Other long term (current) drug therapy: Secondary | ICD-10-CM | POA: Diagnosis not present

## 2022-04-18 DIAGNOSIS — D649 Anemia, unspecified: Secondary | ICD-10-CM | POA: Diagnosis not present

## 2022-04-18 DIAGNOSIS — Z96653 Presence of artificial knee joint, bilateral: Secondary | ICD-10-CM | POA: Diagnosis not present

## 2022-04-18 DIAGNOSIS — I272 Pulmonary hypertension, unspecified: Secondary | ICD-10-CM | POA: Diagnosis not present

## 2022-04-18 DIAGNOSIS — E7800 Pure hypercholesterolemia, unspecified: Secondary | ICD-10-CM | POA: Diagnosis not present

## 2022-04-18 DIAGNOSIS — R739 Hyperglycemia, unspecified: Secondary | ICD-10-CM | POA: Diagnosis not present

## 2022-04-18 DIAGNOSIS — G309 Alzheimer's disease, unspecified: Secondary | ICD-10-CM | POA: Diagnosis not present

## 2022-04-18 DIAGNOSIS — I071 Rheumatic tricuspid insufficiency: Secondary | ICD-10-CM | POA: Diagnosis not present

## 2022-04-18 DIAGNOSIS — J3089 Other allergic rhinitis: Secondary | ICD-10-CM | POA: Diagnosis not present

## 2022-04-18 DIAGNOSIS — Z9181 History of falling: Secondary | ICD-10-CM | POA: Diagnosis not present

## 2022-04-18 DIAGNOSIS — M8580 Other specified disorders of bone density and structure, unspecified site: Secondary | ICD-10-CM | POA: Diagnosis not present

## 2022-04-18 DIAGNOSIS — I10 Essential (primary) hypertension: Secondary | ICD-10-CM | POA: Diagnosis not present

## 2022-04-18 DIAGNOSIS — F0280 Dementia in other diseases classified elsewhere without behavioral disturbance: Secondary | ICD-10-CM | POA: Diagnosis not present

## 2022-04-18 DIAGNOSIS — M810 Age-related osteoporosis without current pathological fracture: Secondary | ICD-10-CM | POA: Diagnosis not present

## 2022-04-27 DIAGNOSIS — M8580 Other specified disorders of bone density and structure, unspecified site: Secondary | ICD-10-CM | POA: Diagnosis not present

## 2022-04-27 DIAGNOSIS — I10 Essential (primary) hypertension: Secondary | ICD-10-CM | POA: Diagnosis not present

## 2022-04-27 DIAGNOSIS — F0280 Dementia in other diseases classified elsewhere without behavioral disturbance: Secondary | ICD-10-CM | POA: Diagnosis not present

## 2022-04-27 DIAGNOSIS — E7800 Pure hypercholesterolemia, unspecified: Secondary | ICD-10-CM | POA: Diagnosis not present

## 2022-04-27 DIAGNOSIS — M810 Age-related osteoporosis without current pathological fracture: Secondary | ICD-10-CM | POA: Diagnosis not present

## 2022-04-27 DIAGNOSIS — D649 Anemia, unspecified: Secondary | ICD-10-CM | POA: Diagnosis not present

## 2022-04-27 DIAGNOSIS — I272 Pulmonary hypertension, unspecified: Secondary | ICD-10-CM | POA: Diagnosis not present

## 2022-04-27 DIAGNOSIS — Z79899 Other long term (current) drug therapy: Secondary | ICD-10-CM | POA: Diagnosis not present

## 2022-04-27 DIAGNOSIS — G309 Alzheimer's disease, unspecified: Secondary | ICD-10-CM | POA: Diagnosis not present

## 2022-04-27 DIAGNOSIS — Z96653 Presence of artificial knee joint, bilateral: Secondary | ICD-10-CM | POA: Diagnosis not present

## 2022-04-27 DIAGNOSIS — Z9181 History of falling: Secondary | ICD-10-CM | POA: Diagnosis not present

## 2022-04-27 DIAGNOSIS — J3089 Other allergic rhinitis: Secondary | ICD-10-CM | POA: Diagnosis not present

## 2022-04-27 DIAGNOSIS — R739 Hyperglycemia, unspecified: Secondary | ICD-10-CM | POA: Diagnosis not present

## 2022-04-27 DIAGNOSIS — I071 Rheumatic tricuspid insufficiency: Secondary | ICD-10-CM | POA: Diagnosis not present

## 2022-05-02 DIAGNOSIS — G309 Alzheimer's disease, unspecified: Secondary | ICD-10-CM | POA: Diagnosis not present

## 2022-05-02 DIAGNOSIS — E7800 Pure hypercholesterolemia, unspecified: Secondary | ICD-10-CM | POA: Diagnosis not present

## 2022-05-02 DIAGNOSIS — I10 Essential (primary) hypertension: Secondary | ICD-10-CM | POA: Diagnosis not present

## 2022-05-02 DIAGNOSIS — F0280 Dementia in other diseases classified elsewhere without behavioral disturbance: Secondary | ICD-10-CM | POA: Diagnosis not present

## 2022-05-02 DIAGNOSIS — M8580 Other specified disorders of bone density and structure, unspecified site: Secondary | ICD-10-CM | POA: Diagnosis not present

## 2022-05-02 DIAGNOSIS — I272 Pulmonary hypertension, unspecified: Secondary | ICD-10-CM | POA: Diagnosis not present

## 2022-05-02 DIAGNOSIS — Z96653 Presence of artificial knee joint, bilateral: Secondary | ICD-10-CM | POA: Diagnosis not present

## 2022-05-02 DIAGNOSIS — Z9181 History of falling: Secondary | ICD-10-CM | POA: Diagnosis not present

## 2022-05-02 DIAGNOSIS — I071 Rheumatic tricuspid insufficiency: Secondary | ICD-10-CM | POA: Diagnosis not present

## 2022-05-02 DIAGNOSIS — R739 Hyperglycemia, unspecified: Secondary | ICD-10-CM | POA: Diagnosis not present

## 2022-05-02 DIAGNOSIS — Z79899 Other long term (current) drug therapy: Secondary | ICD-10-CM | POA: Diagnosis not present

## 2022-05-02 DIAGNOSIS — M810 Age-related osteoporosis without current pathological fracture: Secondary | ICD-10-CM | POA: Diagnosis not present

## 2022-05-02 DIAGNOSIS — D649 Anemia, unspecified: Secondary | ICD-10-CM | POA: Diagnosis not present

## 2022-05-02 DIAGNOSIS — J3089 Other allergic rhinitis: Secondary | ICD-10-CM | POA: Diagnosis not present

## 2022-05-07 DIAGNOSIS — G309 Alzheimer's disease, unspecified: Secondary | ICD-10-CM | POA: Diagnosis not present

## 2022-05-07 DIAGNOSIS — I10 Essential (primary) hypertension: Secondary | ICD-10-CM | POA: Diagnosis not present

## 2022-05-07 DIAGNOSIS — I272 Pulmonary hypertension, unspecified: Secondary | ICD-10-CM | POA: Diagnosis not present

## 2022-05-07 DIAGNOSIS — F0280 Dementia in other diseases classified elsewhere without behavioral disturbance: Secondary | ICD-10-CM | POA: Diagnosis not present

## 2022-05-08 DIAGNOSIS — D649 Anemia, unspecified: Secondary | ICD-10-CM | POA: Diagnosis not present

## 2022-05-08 DIAGNOSIS — Z9181 History of falling: Secondary | ICD-10-CM | POA: Diagnosis not present

## 2022-05-08 DIAGNOSIS — M8580 Other specified disorders of bone density and structure, unspecified site: Secondary | ICD-10-CM | POA: Diagnosis not present

## 2022-05-08 DIAGNOSIS — Z96653 Presence of artificial knee joint, bilateral: Secondary | ICD-10-CM | POA: Diagnosis not present

## 2022-05-08 DIAGNOSIS — J3089 Other allergic rhinitis: Secondary | ICD-10-CM | POA: Diagnosis not present

## 2022-05-08 DIAGNOSIS — E7800 Pure hypercholesterolemia, unspecified: Secondary | ICD-10-CM | POA: Diagnosis not present

## 2022-05-08 DIAGNOSIS — I071 Rheumatic tricuspid insufficiency: Secondary | ICD-10-CM | POA: Diagnosis not present

## 2022-05-08 DIAGNOSIS — I272 Pulmonary hypertension, unspecified: Secondary | ICD-10-CM | POA: Diagnosis not present

## 2022-05-08 DIAGNOSIS — F0280 Dementia in other diseases classified elsewhere without behavioral disturbance: Secondary | ICD-10-CM | POA: Diagnosis not present

## 2022-05-08 DIAGNOSIS — G309 Alzheimer's disease, unspecified: Secondary | ICD-10-CM | POA: Diagnosis not present

## 2022-05-08 DIAGNOSIS — I10 Essential (primary) hypertension: Secondary | ICD-10-CM | POA: Diagnosis not present

## 2022-05-08 DIAGNOSIS — Z79899 Other long term (current) drug therapy: Secondary | ICD-10-CM | POA: Diagnosis not present

## 2022-05-08 DIAGNOSIS — R739 Hyperglycemia, unspecified: Secondary | ICD-10-CM | POA: Diagnosis not present

## 2022-05-08 DIAGNOSIS — M810 Age-related osteoporosis without current pathological fracture: Secondary | ICD-10-CM | POA: Diagnosis not present

## 2022-05-14 DIAGNOSIS — Z9181 History of falling: Secondary | ICD-10-CM | POA: Diagnosis not present

## 2022-05-14 DIAGNOSIS — I272 Pulmonary hypertension, unspecified: Secondary | ICD-10-CM | POA: Diagnosis not present

## 2022-05-14 DIAGNOSIS — M8580 Other specified disorders of bone density and structure, unspecified site: Secondary | ICD-10-CM | POA: Diagnosis not present

## 2022-05-14 DIAGNOSIS — E7800 Pure hypercholesterolemia, unspecified: Secondary | ICD-10-CM | POA: Diagnosis not present

## 2022-05-14 DIAGNOSIS — Z79899 Other long term (current) drug therapy: Secondary | ICD-10-CM | POA: Diagnosis not present

## 2022-05-14 DIAGNOSIS — D649 Anemia, unspecified: Secondary | ICD-10-CM | POA: Diagnosis not present

## 2022-05-14 DIAGNOSIS — G309 Alzheimer's disease, unspecified: Secondary | ICD-10-CM | POA: Diagnosis not present

## 2022-05-14 DIAGNOSIS — Z96653 Presence of artificial knee joint, bilateral: Secondary | ICD-10-CM | POA: Diagnosis not present

## 2022-05-14 DIAGNOSIS — F0280 Dementia in other diseases classified elsewhere without behavioral disturbance: Secondary | ICD-10-CM | POA: Diagnosis not present

## 2022-05-14 DIAGNOSIS — J3089 Other allergic rhinitis: Secondary | ICD-10-CM | POA: Diagnosis not present

## 2022-05-14 DIAGNOSIS — M810 Age-related osteoporosis without current pathological fracture: Secondary | ICD-10-CM | POA: Diagnosis not present

## 2022-05-14 DIAGNOSIS — I10 Essential (primary) hypertension: Secondary | ICD-10-CM | POA: Diagnosis not present

## 2022-05-14 DIAGNOSIS — I071 Rheumatic tricuspid insufficiency: Secondary | ICD-10-CM | POA: Diagnosis not present

## 2022-05-14 DIAGNOSIS — R739 Hyperglycemia, unspecified: Secondary | ICD-10-CM | POA: Diagnosis not present

## 2022-05-17 DIAGNOSIS — Z96653 Presence of artificial knee joint, bilateral: Secondary | ICD-10-CM | POA: Diagnosis not present

## 2022-05-17 DIAGNOSIS — G309 Alzheimer's disease, unspecified: Secondary | ICD-10-CM | POA: Diagnosis not present

## 2022-05-17 DIAGNOSIS — M8580 Other specified disorders of bone density and structure, unspecified site: Secondary | ICD-10-CM | POA: Diagnosis not present

## 2022-05-17 DIAGNOSIS — Z79899 Other long term (current) drug therapy: Secondary | ICD-10-CM | POA: Diagnosis not present

## 2022-05-17 DIAGNOSIS — J3089 Other allergic rhinitis: Secondary | ICD-10-CM | POA: Diagnosis not present

## 2022-05-17 DIAGNOSIS — I10 Essential (primary) hypertension: Secondary | ICD-10-CM | POA: Diagnosis not present

## 2022-05-17 DIAGNOSIS — R739 Hyperglycemia, unspecified: Secondary | ICD-10-CM | POA: Diagnosis not present

## 2022-05-17 DIAGNOSIS — I272 Pulmonary hypertension, unspecified: Secondary | ICD-10-CM | POA: Diagnosis not present

## 2022-05-17 DIAGNOSIS — Z9181 History of falling: Secondary | ICD-10-CM | POA: Diagnosis not present

## 2022-05-17 DIAGNOSIS — I071 Rheumatic tricuspid insufficiency: Secondary | ICD-10-CM | POA: Diagnosis not present

## 2022-05-17 DIAGNOSIS — F0280 Dementia in other diseases classified elsewhere without behavioral disturbance: Secondary | ICD-10-CM | POA: Diagnosis not present

## 2022-05-17 DIAGNOSIS — E7800 Pure hypercholesterolemia, unspecified: Secondary | ICD-10-CM | POA: Diagnosis not present

## 2022-05-17 DIAGNOSIS — D649 Anemia, unspecified: Secondary | ICD-10-CM | POA: Diagnosis not present

## 2022-05-17 DIAGNOSIS — M810 Age-related osteoporosis without current pathological fracture: Secondary | ICD-10-CM | POA: Diagnosis not present

## 2022-05-23 DIAGNOSIS — R739 Hyperglycemia, unspecified: Secondary | ICD-10-CM | POA: Diagnosis not present

## 2022-05-23 DIAGNOSIS — Z79899 Other long term (current) drug therapy: Secondary | ICD-10-CM | POA: Diagnosis not present

## 2022-05-23 DIAGNOSIS — J3089 Other allergic rhinitis: Secondary | ICD-10-CM | POA: Diagnosis not present

## 2022-05-23 DIAGNOSIS — Z9181 History of falling: Secondary | ICD-10-CM | POA: Diagnosis not present

## 2022-05-23 DIAGNOSIS — M8580 Other specified disorders of bone density and structure, unspecified site: Secondary | ICD-10-CM | POA: Diagnosis not present

## 2022-05-23 DIAGNOSIS — D649 Anemia, unspecified: Secondary | ICD-10-CM | POA: Diagnosis not present

## 2022-05-23 DIAGNOSIS — I272 Pulmonary hypertension, unspecified: Secondary | ICD-10-CM | POA: Diagnosis not present

## 2022-05-23 DIAGNOSIS — I071 Rheumatic tricuspid insufficiency: Secondary | ICD-10-CM | POA: Diagnosis not present

## 2022-05-23 DIAGNOSIS — I10 Essential (primary) hypertension: Secondary | ICD-10-CM | POA: Diagnosis not present

## 2022-05-23 DIAGNOSIS — E7800 Pure hypercholesterolemia, unspecified: Secondary | ICD-10-CM | POA: Diagnosis not present

## 2022-05-23 DIAGNOSIS — G309 Alzheimer's disease, unspecified: Secondary | ICD-10-CM | POA: Diagnosis not present

## 2022-05-23 DIAGNOSIS — F0280 Dementia in other diseases classified elsewhere without behavioral disturbance: Secondary | ICD-10-CM | POA: Diagnosis not present

## 2022-05-23 DIAGNOSIS — M810 Age-related osteoporosis without current pathological fracture: Secondary | ICD-10-CM | POA: Diagnosis not present

## 2022-05-23 DIAGNOSIS — Z96653 Presence of artificial knee joint, bilateral: Secondary | ICD-10-CM | POA: Diagnosis not present

## 2022-05-30 DIAGNOSIS — G309 Alzheimer's disease, unspecified: Secondary | ICD-10-CM | POA: Diagnosis not present

## 2022-05-30 DIAGNOSIS — E7800 Pure hypercholesterolemia, unspecified: Secondary | ICD-10-CM | POA: Diagnosis not present

## 2022-05-30 DIAGNOSIS — J3089 Other allergic rhinitis: Secondary | ICD-10-CM | POA: Diagnosis not present

## 2022-05-30 DIAGNOSIS — D649 Anemia, unspecified: Secondary | ICD-10-CM | POA: Diagnosis not present

## 2022-05-30 DIAGNOSIS — R739 Hyperglycemia, unspecified: Secondary | ICD-10-CM | POA: Diagnosis not present

## 2022-05-30 DIAGNOSIS — Z79899 Other long term (current) drug therapy: Secondary | ICD-10-CM | POA: Diagnosis not present

## 2022-05-30 DIAGNOSIS — Z96653 Presence of artificial knee joint, bilateral: Secondary | ICD-10-CM | POA: Diagnosis not present

## 2022-05-30 DIAGNOSIS — M810 Age-related osteoporosis without current pathological fracture: Secondary | ICD-10-CM | POA: Diagnosis not present

## 2022-05-30 DIAGNOSIS — I071 Rheumatic tricuspid insufficiency: Secondary | ICD-10-CM | POA: Diagnosis not present

## 2022-05-30 DIAGNOSIS — Z9181 History of falling: Secondary | ICD-10-CM | POA: Diagnosis not present

## 2022-05-30 DIAGNOSIS — M8580 Other specified disorders of bone density and structure, unspecified site: Secondary | ICD-10-CM | POA: Diagnosis not present

## 2022-05-30 DIAGNOSIS — I10 Essential (primary) hypertension: Secondary | ICD-10-CM | POA: Diagnosis not present

## 2022-05-30 DIAGNOSIS — F0280 Dementia in other diseases classified elsewhere without behavioral disturbance: Secondary | ICD-10-CM | POA: Diagnosis not present

## 2022-05-30 DIAGNOSIS — I272 Pulmonary hypertension, unspecified: Secondary | ICD-10-CM | POA: Diagnosis not present

## 2022-05-31 DIAGNOSIS — H353211 Exudative age-related macular degeneration, right eye, with active choroidal neovascularization: Secondary | ICD-10-CM | POA: Diagnosis not present

## 2022-06-04 DIAGNOSIS — F0280 Dementia in other diseases classified elsewhere without behavioral disturbance: Secondary | ICD-10-CM | POA: Diagnosis not present

## 2022-06-04 DIAGNOSIS — R739 Hyperglycemia, unspecified: Secondary | ICD-10-CM | POA: Diagnosis not present

## 2022-06-04 DIAGNOSIS — Z9181 History of falling: Secondary | ICD-10-CM | POA: Diagnosis not present

## 2022-06-04 DIAGNOSIS — Z96653 Presence of artificial knee joint, bilateral: Secondary | ICD-10-CM | POA: Diagnosis not present

## 2022-06-04 DIAGNOSIS — M8580 Other specified disorders of bone density and structure, unspecified site: Secondary | ICD-10-CM | POA: Diagnosis not present

## 2022-06-04 DIAGNOSIS — J3089 Other allergic rhinitis: Secondary | ICD-10-CM | POA: Diagnosis not present

## 2022-06-04 DIAGNOSIS — I071 Rheumatic tricuspid insufficiency: Secondary | ICD-10-CM | POA: Diagnosis not present

## 2022-06-04 DIAGNOSIS — I272 Pulmonary hypertension, unspecified: Secondary | ICD-10-CM | POA: Diagnosis not present

## 2022-06-04 DIAGNOSIS — M810 Age-related osteoporosis without current pathological fracture: Secondary | ICD-10-CM | POA: Diagnosis not present

## 2022-06-04 DIAGNOSIS — G309 Alzheimer's disease, unspecified: Secondary | ICD-10-CM | POA: Diagnosis not present

## 2022-06-04 DIAGNOSIS — D649 Anemia, unspecified: Secondary | ICD-10-CM | POA: Diagnosis not present

## 2022-06-04 DIAGNOSIS — I10 Essential (primary) hypertension: Secondary | ICD-10-CM | POA: Diagnosis not present

## 2022-06-04 DIAGNOSIS — E7800 Pure hypercholesterolemia, unspecified: Secondary | ICD-10-CM | POA: Diagnosis not present

## 2022-06-04 DIAGNOSIS — Z79899 Other long term (current) drug therapy: Secondary | ICD-10-CM | POA: Diagnosis not present

## 2022-09-18 ENCOUNTER — Other Ambulatory Visit: Payer: Self-pay

## 2022-09-18 ENCOUNTER — Ambulatory Visit
Admission: RE | Admit: 2022-09-18 | Discharge: 2022-09-18 | Disposition: A | Discharge status: Home / Self Care | Payer: Medicare Other | Source: Ambulatory Visit | Attending: Physician Assistant | Admitting: Physician Assistant

## 2022-09-18 ENCOUNTER — Other Ambulatory Visit: Payer: Self-pay | Admitting: Physician Assistant

## 2022-09-18 DIAGNOSIS — R42 Dizziness and giddiness: Secondary | ICD-10-CM
